# Patient Record
Sex: Female | Born: 1965 | Race: White | Hispanic: No | Marital: Married | Smoking: Never smoker
Health system: Southern US, Community
[De-identification: ages and names within clinical notes are randomized; demographics above are authoritative.]

## PROBLEM LIST (undated history)

## (undated) DIAGNOSIS — J45909 Unspecified asthma, uncomplicated: Secondary | ICD-10-CM

## (undated) DIAGNOSIS — F419 Anxiety disorder, unspecified: Secondary | ICD-10-CM

## (undated) DIAGNOSIS — J309 Allergic rhinitis, unspecified: Secondary | ICD-10-CM

## (undated) HISTORY — PX: AUGMENTATION MAMMAPLASTY: SUR837

## (undated) HISTORY — DX: Unspecified asthma, uncomplicated: J45.909

## (undated) HISTORY — DX: Anxiety disorder, unspecified: F41.9

## (undated) HISTORY — DX: Allergic rhinitis, unspecified: J30.9

---

## 2010-12-11 ENCOUNTER — Ambulatory Visit (HOSPITAL_BASED_OUTPATIENT_CLINIC_OR_DEPARTMENT_OTHER)
Admission: RE | Admit: 2010-12-11 | Discharge: 2010-12-11 | Disposition: A | Discharge status: Home / Self Care | Payer: BC Managed Care – PPO | Source: Ambulatory Visit | Attending: Orthopedic Surgery | Admitting: Orthopedic Surgery

## 2010-12-11 DIAGNOSIS — M2020 Hallux rigidus, unspecified foot: Secondary | ICD-10-CM | POA: Insufficient documentation

## 2010-12-11 DIAGNOSIS — G4733 Obstructive sleep apnea (adult) (pediatric): Secondary | ICD-10-CM | POA: Insufficient documentation

## 2010-12-11 DIAGNOSIS — J45909 Unspecified asthma, uncomplicated: Secondary | ICD-10-CM | POA: Insufficient documentation

## 2011-01-01 NOTE — Op Note (Signed)
NAME:  Vickie Ross, Vickie Ross                ACCOUNT NO.:  [id number]  MEDICAL RECORD NO.:  [id number]           PATIENT TYPE:  LOCATION:                                 FACILITY:  PHYSICIAN:  Toni Arthurs, MD             DATE OF BIRTH:  DATE OF PROCEDURE:  12/11/2010 DATE OF DISCHARGE:                              OPERATIVE REPORT   PREOPERATIVE DIAGNOSIS:  Right hallux rigidus, status post Moberg and chevron osteotomies.  POSTOPERATIVE DIAGNOSIS:  Right hallux rigidus, status post Moberg and chevron osteotomies.  PROCEDURE: 1. Right hallux removal of deep implant x2. 2. Right hallux metatarsophalangeal joint arthrodesis. 3. Intraoperative interpretation of fluoroscopic images.  SURGEON:  Toni Arthurs, MD  ANESTHESIA:  General, regional.  IV FLUIDS:  See anesthesia record.  ESTIMATED BLOOD LOSS:  Minimal.  TOURNIQUET TIME:  1 hour and 28 minutes with an ankle Esmarch.  COMPLICATIONS:  None apparent.  DISPOSITION:  Extubated awake and stable to recovery.  INDICATIONS FOR PROCEDURE:  The patient is a 45 year old female without significant past medical history who has a long history of right hallux MP joint pain.  She had a Moberg osteotomy combined with dorsiflexion osteotomy of the first metatarsal in the past.  She has continued to have significant pain limiting her activities.  She has failed conservative treatment with activity modification orthotics including a Morton's extension and oral anti-inflammatories.  She presents now for arthrodesis of her right hallux MP joint.  She understands the risks and benefits of this procedure including risks of bleeding, infection, nerve damage, blood clots, need for additional surgery, failure to heal, amputation, and death.  She would like to proceed.  PROCEDURE IN DETAIL:  After preoperative consent was obtained and the correct operative site was identified, the patient was brought to the operating room and placed supine on the  operating table.  General anesthesia was induced.  Regional anesthesia had previously been administered.  Preoperative antibiotics were administered.  Surgical time-out was taken.  The right lower extremity was prepped and draped in standard sterile fashion.  A longitudinal incision was marked on the skin just medial from the extensor hallucis longus tendon.  The extremity was exsanguinated and an Esmarch tourniquet was wrapped around the ankle.  The dorsal incision was made and sharp dissection was carried down through the skin.  Blunt dissection was carried down to the level of the EHL tendon.  The joint capsule was incised just medial to the tendon.  The tendon was retracted laterally and protected throughout the case.  The medial and lateral collateral ligaments were released. Immediately evident were significant osteophytes surrounding the joint as well as cartilage degeneration.  A 0.0625 K-wire was inserted in the center of the head of the metatarsal.  An 18-mm concave reamer was used to resect all of the articular cartilage down to healthy subchondral bone.  The 20-mm barrel reamer was then used to resect all peripheral osteophytes along with a rongeur.  The guide pin was removed.  The guidepin was then inserted into the center of the base of the proximal phalanx.  The convex 18-mm reamer was then used to resect all the articular cartilage back to healthy subchondral bone.  The wound was irrigated copiously.  All remaining bits of cartilage were removed.  The 2-mm drill bit was then used to perforate the base of the proximal phalanx and the head of the metatarsal creating local bone graft.  The joint was reduced.  A guide pin was then driven obliquely across the joint from the proximal medial aspect of the metaphyseal bone to the distal lateral aspect of the base of the proximal phalanx.  A partially threaded cannulated lag screw was then inserted over the guide pin through a stab  incision.  Excellent compression was noted across the joint as well as excellent purchase of the screw.  The head of the screw dorsally at the metatarsal was then identified.  A curette was used to remove all soft tissues from the screw heads.  Screw was then backed out in its entirety and passed off the field.  At the medial aspect of the hallux, a screw head was identified.  It was partially grown over with bone.  This was resected with a rongeur and curettes.  The screw was then backed out, removed in its entirety.  An Arthrex hallux MP joint arthrodesis plate was then selected and applied to the dorsum of the toe.  AP and lateral x-ray showed appropriate position of this plate as well as confirming appropriate position of the lag screw.  The plate was held in position with an olive wire.  The distal nonlocking hole was drilled and a fully-threaded screw was inserted securing the plate to the base of the proximal phalanx.  The proximal oblong hole was drilled eccentrically and a nonlocking bicortical screw was inserted further compressing the joint.  The remaining holes in the plate were drilled and filled with bicortical locking screws.  The position of the hallux was then tested with a footplate.  This had been done prior to insertion of the plate as well.  This confirmed the appropriate orientation of the hallux in dorsiflexion such that the tip of the toe was only 1 mm off the surface with simulated weightbearing.  The wound was then irrigated copiously.  Metaphyseal bone from the base of the proximal phalanx was resected medially.  This was overhanging bone that was then packed into the dorsal joint line.  Inverted simple sutures of 3-0 Monocryl were used to close the subcutaneous tissue and joint capsule over the plate. The skin was then closed with a running suture of 3-0 Prolene.  Sterile dressings were applied followed by a well-padded short-leg splint.  The patient was then  awakened from anesthesia and transported to the recovery room in stable condition.  FOLLOWUP PLAN:  The patient will be nonweightbearing on her splint for the next 2 weeks.  She will then follow up with me in clinic for suture removal and a wound check.     Toni Arthurs, MD     JH/MEDQ  D:  12/11/2010  T:  12/11/2010  Job:  [id number]  Electronically Signed by Toni Arthurs  on 01/01/2011 10:28:55 PM

## 2011-05-24 ENCOUNTER — Other Ambulatory Visit: Payer: Self-pay | Admitting: Family Medicine

## 2011-05-24 DIAGNOSIS — Z1231 Encounter for screening mammogram for malignant neoplasm of breast: Secondary | ICD-10-CM

## 2011-06-01 ENCOUNTER — Ambulatory Visit: Payer: BC Managed Care – PPO

## 2011-06-03 ENCOUNTER — Ambulatory Visit
Admission: RE | Admit: 2011-06-03 | Discharge: 2011-06-03 | Disposition: A | Discharge status: Home / Self Care | Payer: BC Managed Care – PPO | Source: Ambulatory Visit | Attending: Family Medicine | Admitting: Family Medicine

## 2011-06-03 DIAGNOSIS — Z1231 Encounter for screening mammogram for malignant neoplasm of breast: Secondary | ICD-10-CM

## 2011-06-09 ENCOUNTER — Other Ambulatory Visit: Payer: Self-pay | Admitting: Family Medicine

## 2011-06-09 DIAGNOSIS — R928 Other abnormal and inconclusive findings on diagnostic imaging of breast: Secondary | ICD-10-CM

## 2011-07-26 ENCOUNTER — Ambulatory Visit
Admission: RE | Admit: 2011-07-26 | Discharge: 2011-07-26 | Disposition: A | Discharge status: Home / Self Care | Payer: BC Managed Care – PPO | Source: Ambulatory Visit | Attending: Family Medicine | Admitting: Family Medicine

## 2011-07-26 ENCOUNTER — Other Ambulatory Visit: Payer: Self-pay | Admitting: Internal Medicine

## 2011-07-26 DIAGNOSIS — R928 Other abnormal and inconclusive findings on diagnostic imaging of breast: Secondary | ICD-10-CM

## 2012-07-03 ENCOUNTER — Other Ambulatory Visit: Payer: Self-pay | Admitting: Family Medicine

## 2012-07-03 DIAGNOSIS — Z1231 Encounter for screening mammogram for malignant neoplasm of breast: Secondary | ICD-10-CM

## 2012-08-04 ENCOUNTER — Ambulatory Visit
Admission: RE | Admit: 2012-08-04 | Discharge: 2012-08-04 | Disposition: A | Discharge status: Home / Self Care | Payer: BC Managed Care – PPO | Source: Ambulatory Visit | Attending: Family Medicine | Admitting: Family Medicine

## 2012-08-04 DIAGNOSIS — Z1231 Encounter for screening mammogram for malignant neoplasm of breast: Secondary | ICD-10-CM

## 2012-12-08 IMAGING — US US BREAST*R*
1 series · 3 of 3 positions shown · non-contrast
Comparison: 06/03/2011.

CLINICAL DATA: Call back from screening, right breast.

DIGITAL DIAGNOSTIC RIGHT MAMMOGRAM  AND RIGHT BREAST ULTRASOUND:

[Series 1: us breast*right* · 3 of 3 slices shown]
[im 1/3]
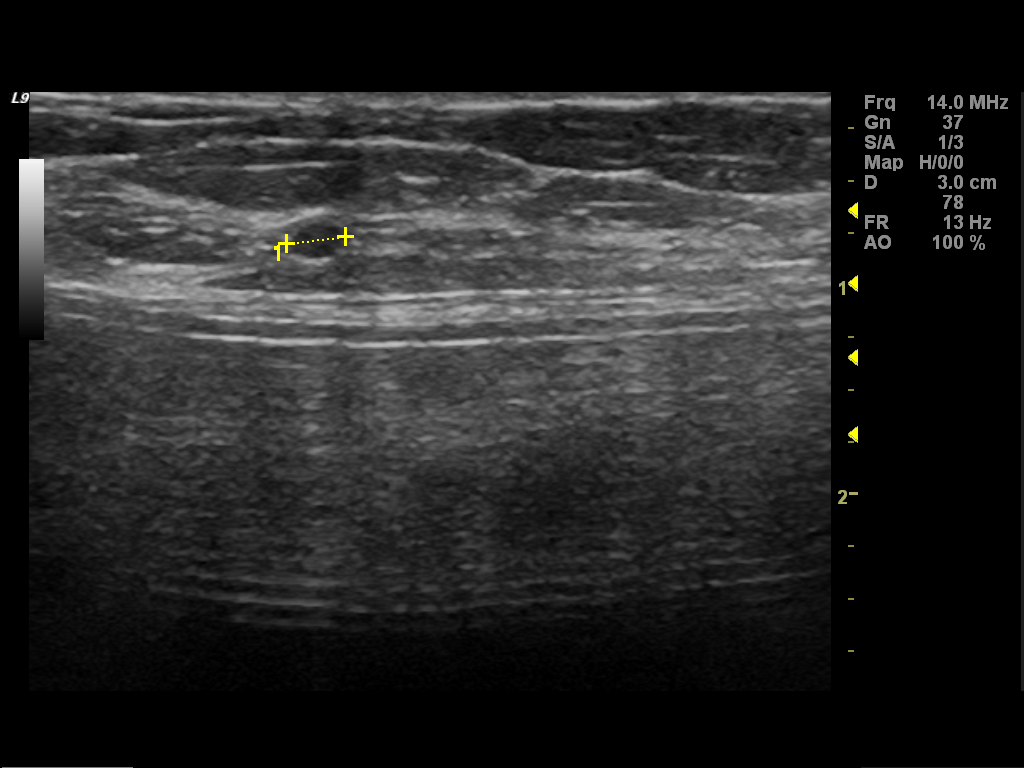
[im 2/3]
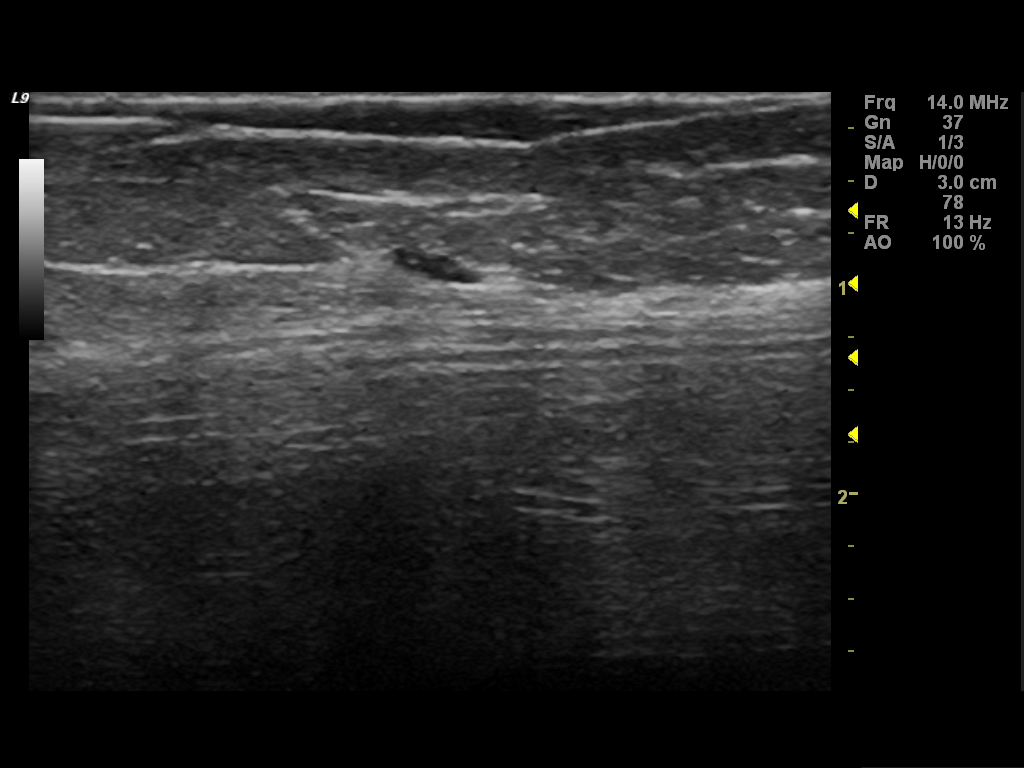
[im 3/3]
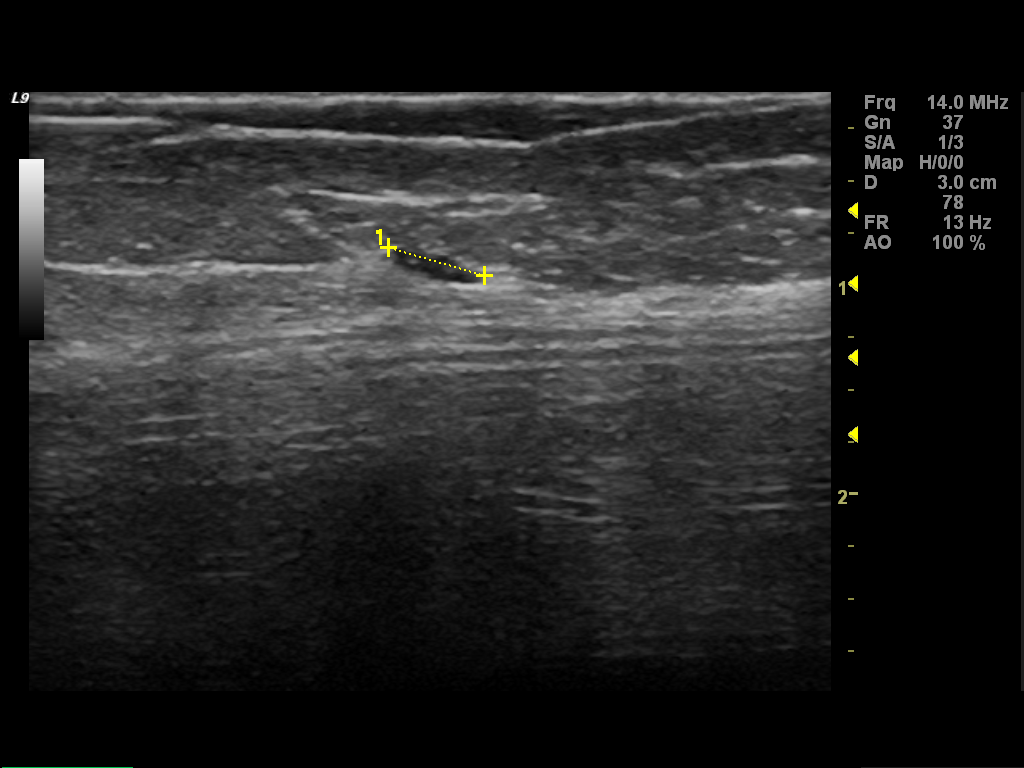

[3 of 3 positions shown; findings below may reference images not displayed]

FINDINGS: Spot magnification CC and MLO views demonstrate no mass,
nonsurgical distortion, or suspicious calcifications in the lateral
right breast.

On physical exam, I palpate no mass in the lateral right breast.

Ultrasound is performed, showing an approximate 5 x 3 mm cyst with
internal echoes in the 9 o'clock position approximately 4 cm from
the nipple.
IMPRESSION: Benign findings, right breast.  Approximate 5 mm complex cyst in
the 9 o'clock position approximately 4 cm from the nipple.

Recommendations:  Routine screening mammography in 1 year.

BI-RADS CATEGORY 2:  Benign finding(s).

## 2013-07-18 ENCOUNTER — Other Ambulatory Visit: Payer: Self-pay

## 2013-07-18 DIAGNOSIS — Z1231 Encounter for screening mammogram for malignant neoplasm of breast: Secondary | ICD-10-CM

## 2013-08-10 ENCOUNTER — Ambulatory Visit
Admission: RE | Admit: 2013-08-10 | Discharge: 2013-08-10 | Disposition: A | Discharge status: Home / Self Care | Payer: BC Managed Care – PPO | Source: Ambulatory Visit

## 2013-08-10 DIAGNOSIS — Z1231 Encounter for screening mammogram for malignant neoplasm of breast: Secondary | ICD-10-CM

## 2013-12-12 HISTORY — PX: FOOT SURGERY: SHX648

## 2014-07-09 ENCOUNTER — Other Ambulatory Visit: Payer: Self-pay

## 2014-07-09 DIAGNOSIS — Z1231 Encounter for screening mammogram for malignant neoplasm of breast: Secondary | ICD-10-CM

## 2014-08-23 ENCOUNTER — Encounter (INDEPENDENT_AMBULATORY_CARE_PROVIDER_SITE_OTHER): Payer: Self-pay

## 2014-08-23 ENCOUNTER — Ambulatory Visit
Admission: RE | Admit: 2014-08-23 | Discharge: 2014-08-23 | Disposition: A | Discharge status: Home / Self Care | Payer: BC Managed Care – PPO | Source: Ambulatory Visit

## 2014-08-23 DIAGNOSIS — Z1231 Encounter for screening mammogram for malignant neoplasm of breast: Secondary | ICD-10-CM

## 2015-07-08 ENCOUNTER — Ambulatory Visit: Payer: Self-pay | Admitting: Pediatrics

## 2015-07-28 ENCOUNTER — Other Ambulatory Visit: Payer: Self-pay

## 2015-07-28 DIAGNOSIS — Z1231 Encounter for screening mammogram for malignant neoplasm of breast: Secondary | ICD-10-CM

## 2015-08-04 ENCOUNTER — Encounter: Payer: Self-pay | Admitting: Allergy and Immunology

## 2015-08-04 ENCOUNTER — Ambulatory Visit (INDEPENDENT_AMBULATORY_CARE_PROVIDER_SITE_OTHER): Payer: BLUE CROSS/BLUE SHIELD | Admitting: Allergy and Immunology

## 2015-08-04 VITALS — BP 110/70 | HR 76 | Temp 97.8°F | Resp 16 | Ht 67.5 in | Wt 150.8 lb

## 2015-08-04 DIAGNOSIS — T7800XA Anaphylactic reaction due to unspecified food, initial encounter: Secondary | ICD-10-CM

## 2015-08-04 DIAGNOSIS — J4520 Mild intermittent asthma, uncomplicated: Secondary | ICD-10-CM | POA: Diagnosis not present

## 2015-08-04 DIAGNOSIS — J310 Chronic rhinitis: Secondary | ICD-10-CM | POA: Diagnosis not present

## 2015-08-04 MED ORDER — ALBUTEROL SULFATE HFA 108 (90 BASE) MCG/ACT IN AERS: 2.0000 | INHALATION_SPRAY | RESPIRATORY_TRACT | Status: AC | PRN

## 2015-08-04 MED ORDER — EPINEPHRINE 0.3 MG/0.3ML IJ SOAJ: INTRAMUSCULAR | Status: AC

## 2015-08-04 MED ORDER — FLUTICASONE PROPIONATE 50 MCG/ACT NA SUSP: NASAL | Status: DC

## 2015-08-04 NOTE — Patient Instructions (Signed)
Food allergy Food allergy. 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  Meticulous avoidance of egg and dairy products as discussed.  A prescription has been provided for epinephrine auto-injector 2 pack along with instructions for proper administration.  A food allergy action plan has been provided and discussed.  Medic Alert identification is recommended.   Chronic rhinitis Non-allergic rhinitis.  All seasonal and perennial aeroallergen skin tests are negative despite a positive histamine control.  Intranasal steroids and intranasal antihistamines are effective for symptoms associated with non-allergic rhinitis, whereas second generation antihistamines such as cetirizine, loratadine and fexofenadine have been found to be ineffective for this condition.  A prescription has been provided for fluticasone nasal spray, one spray per nostril 1-2 times daily as needed. Proper nasal spray technique has been discussed and demonstrated.  Nasal saline lavage as needed has been recommended along with instructions for proper administration.  Mild intermittent asthma  Continue albuterol HFA, 1-2 inhalations every 4-6 hours as needed and 15 minutes prior to vigorous exercise.  Subjective and objective measures of pulmonary function will be followed and the treatment plan will be adjusted accordingly.   Return in about 6 months (around 02/01/2016), or if symptoms worsen or fail to improve.

## 2015-08-04 NOTE — Assessment & Plan Note (Signed)
   Continue albuterol HFA, 1-2 inhalations every 4-6 hours as needed and 15 minutes prior to vigorous exercise.  Subjective and objective measures of pulmonary function will be followed and the treatment plan will be adjusted accordingly. 

## 2015-08-04 NOTE — Assessment & Plan Note (Addendum)
Food allergy. 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  Meticulous avoidance of egg and dairy products as discussed.  A prescription has been provided for epinephrine auto-injector 2 pack along with instructions for proper administration.  A food allergy action plan has been provided and discussed.  Medic Alert identification is recommended.

## 2015-08-04 NOTE — Assessment & Plan Note (Signed)
Non-allergic rhinitis.  All seasonal and perennial aeroallergen skin tests are negative despite a positive histamine control.  Intranasal steroids and intranasal antihistamines are effective for symptoms associated with non-allergic rhinitis, whereas second generation antihistamines such as cetirizine, loratadine and fexofenadine have been found to be ineffective for this condition.  A prescription has been provided for fluticasone nasal spray, one spray per nostril 1-2 times daily as needed. Proper nasal spray technique has been discussed and demonstrated.  Nasal saline lavage as needed has been recommended along with instructions for proper administration. 

## 2015-08-04 NOTE — Progress Notes (Signed)
History of present illness: HPI Comments: Vickie Ross is a 49 y.o. female who presents today for her initial consultation of possible food allergies.  She reports that she has experienced GI symptoms which she describes as "like having the stomach flu", including bloating, abdominal cramping, and diarrhea "all day."  On occasion, she has experienced chest tightness and dyspnea in conjunction with the GI symptoms.  Specific food triggers include eggs and, to a lesser degree, dairy products. These symptoms have progressed over the past decade.   She was found to be H. pylori positive, however after H. pylori was eradicated there was no discernible symptom improvement.  She was diagnosed with asthma 16 years ago.  Her symptoms are triggered by exercise and upper respiratory tract infections.  She experiences nasal congestion, postnasal drainage, and sinus pressure between the eyes.  She describes these nasal/sinus symptoms as "horrible all of the time."   No significant seasonal symptom variation has been noted nor have specific environmental triggers been identified.    Assessment and plan: Food allergy Food allergy. 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  Meticulous avoidance of egg and dairy products as discussed.  A prescription has been provided for epinephrine auto-injector 2 pack along with instructions for proper administration.  A food allergy action plan has been provided and discussed.  Medic Alert identification is recommended.  Chronic rhinitis Non-allergic rhinitis.  All seasonal and perennial aeroallergen skin tests are negative despite a positive histamine control.  Intranasal steroids and intranasal antihistamines are effective for symptoms associated with non-allergic rhinitis, whereas second generation antihistamines such as cetirizine, loratadine and fexofenadine have been found to be ineffective for  this condition.  A prescription has been provided for fluticasone nasal spray, one spray per nostril 1-2 times daily as needed. Proper nasal spray technique has been discussed and demonstrated.  Nasal saline lavage as needed has been recommended along with instructions for proper administration.  Mild intermittent asthma  Continue albuterol HFA, 1-2 inhalations every 4-6 hours as needed and 15 minutes prior to vigorous exercise.  Subjective and objective measures of pulmonary function will be followed and the treatment plan will be adjusted accordingly.    Medications ordered this encounter: Meds ordered this encounter  Medications  . EPINEPHrine (EPIPEN 2-PAK) 0.3 mg/0.3 mL IJ SOAJ injection    Sig: USE AS DIRECTED FOR SEVERE ALLERGIC REACTION.    Dispense:  1 Device    Refill:  2  . fluticasone (FLONASE) 50 MCG/ACT nasal spray    Sig: ONE SPRAY EACH NOSTRIL ONE TO TWO TIMES A DAY AS NEEDED FOR NASAL CONGESTION OR DRAINAGE.    Dispense:  16 g    Refill:  5  . albuterol (PROAIR HFA) 108 (90 BASE) MCG/ACT inhaler    Sig: Inhale 2 puffs into the lungs every 4 (four) hours as needed for wheezing or shortness of breath.    Dispense:  1 Inhaler    Refill:  3    Diagnositics: Spirometry: Normal with an FEV1 of 97% predicted. Please see scanned spirometry results for details. Environmental skin testing: Negative despite a positive histamine control. Food allergen skin testing: Positive to egg white, cow's milk, and casein.    Physical examination: Blood pressure 110/70, pulse 76, temperature 97.8 F (36.6 C), temperature source Oral, resp. rate 16, height 5' 7.5" (1.715 m), weight 150 lb 12.8 oz (68.402 kg).  General: Alert, interactive, in no acute distress. HEENT: TMs  pearly gray, turbinates moderately edematous without discharge, post-pharynx mildly erythematous. Neck: Supple without lymphadenopathy. Lungs: Clear to auscultation without wheezing, rhonchi or rales. CV: Normal  S1, S2 without murmurs. Abdomen: Nondistended, nontender. Skin: Warm and dry, without lesions or rashes. Extremities:  No clubbing, cyanosis or edema. Neuro:   Grossly intact.  Review of systems: Review of Systems  Constitutional: Negative for fever, chills and weight loss.  HENT: Negative for nosebleeds.   Eyes: Negative for blurred vision.  Respiratory: Negative for hemoptysis.   Cardiovascular: Negative for chest pain.  Gastrointestinal: Positive for abdominal pain and diarrhea. Negative for constipation.  Genitourinary: Negative for dysuria.  Musculoskeletal: Negative for myalgias and joint pain.  Neurological: Negative for dizziness.  Endo/Heme/Allergies: Does not bruise/bleed easily.    Past medical history: Past Medical History  Diagnosis Date  . Asthma   . Allergic rhinitis     Past surgical history: Past Surgical History  Procedure Laterality Date  . Foot surgery Right 12/2013    Family history: Family History  Problem Relation Age of Onset  . Asthma Mother   . Allergic rhinitis Mother   . Angioedema Neg Hx   . Eczema Neg Hx   . Immunodeficiency Neg Hx   . Urticaria Neg Hx   . Asthma Son   . Allergic rhinitis Son     Social history: Social History   Social History  . Marital Status: Married    Spouse Name: N/A  . Number of Children: N/A  . Years of Education: N/A   Occupational History  . Not on file.   Social History Main Topics  . Smoking status: Never Smoker   . Smokeless tobacco: Never Used  . Alcohol Use: 1.2 oz/week    1 Glasses of wine, 1 Shots of liquor per week  . Drug Use: No  . Sexual Activity: Yes   Other Topics Concern  . Not on file   Social History Narrative  . No narrative on file   Environmental History:  Zitlalli lives in a 64-year-old house with carpeting in the bedroom and central air/heat.  There is a dog in house which has access to her bedroom. She is a nonsmoker anere are no smokers in the household.  Known  medication allergies: Allergies  Allergen Reactions  . Eggs Or Egg-Derived Products Diarrhea and Nausea And Vomiting    Stomach cramping    Outpatient medications:   Medication List       This list is accurate as of: 08/04/15 10:20 AM.  Always use your most recent med list.               albuterol 108 (90 BASE) MCG/ACT inhaler  Commonly known as:  PROAIR HFA  Inhale 2 puffs into the lungs every 4 (four) hours as needed for wheezing or shortness of breath.     EPINEPHrine 0.3 mg/0.3 mL Soaj injection  Commonly known as:  EPIPEN 2-PAK  USE AS DIRECTED FOR SEVERE ALLERGIC REACTION.     fluticasone 50 MCG/ACT nasal spray  Commonly known as:  FLONASE  ONE SPRAY EACH NOSTRIL ONE TO TWO TIMES A DAY AS NEEDED FOR NASAL CONGESTION OR DRAINAGE.     metroNIDAZOLE 1 % gel  Commonly known as:  METROGEL     minocycline 100 MG capsule  Commonly known as:  MINOCIN,DYNACIN     MULTI-VITAMINS Tabs  Take by mouth.     NEOMYCIN-POLYMYXIN-HYDROCORTISONE 1 % Soln otic solution  Commonly known as:  CORTISPORIN  PLACE 1  TO 2 DROPS ON NAIL-BED BID     sulfamethoxazole-trimethoprim 800-160 MG tablet  Commonly known as:  BACTRIM DS,SEPTRA DS  TK 1 T PO QD        I appreciate the opportunity to take part in this [place] care. Please do not hesitate to contact me with questions.  Sincerely,   R. Edgar Frisk, MD

## 2015-08-05 ENCOUNTER — Encounter: Payer: Self-pay | Admitting: *Deleted

## 2015-09-02 ENCOUNTER — Ambulatory Visit
Admission: RE | Admit: 2015-09-02 | Discharge: 2015-09-02 | Disposition: A | Discharge status: Home / Self Care | Payer: BLUE CROSS/BLUE SHIELD | Source: Ambulatory Visit

## 2015-09-02 DIAGNOSIS — Z1231 Encounter for screening mammogram for malignant neoplasm of breast: Secondary | ICD-10-CM

## 2016-06-18 ENCOUNTER — Other Ambulatory Visit: Payer: Self-pay | Admitting: Physician Assistant

## 2016-06-18 ENCOUNTER — Other Ambulatory Visit: Payer: Self-pay | Admitting: Anesthesiology

## 2016-06-18 DIAGNOSIS — Z1231 Encounter for screening mammogram for malignant neoplasm of breast: Secondary | ICD-10-CM

## 2016-09-03 ENCOUNTER — Ambulatory Visit
Admission: RE | Admit: 2016-09-03 | Discharge: 2016-09-03 | Disposition: A | Discharge status: Home / Self Care | Payer: BLUE CROSS/BLUE SHIELD | Source: Ambulatory Visit | Attending: Anesthesiology | Admitting: Anesthesiology

## 2016-09-03 DIAGNOSIS — Z1231 Encounter for screening mammogram for malignant neoplasm of breast: Secondary | ICD-10-CM

## 2016-09-16 ENCOUNTER — Other Ambulatory Visit: Payer: Self-pay | Admitting: Physician Assistant

## 2016-09-16 DIAGNOSIS — Z1231 Encounter for screening mammogram for malignant neoplasm of breast: Secondary | ICD-10-CM

## 2017-09-09 ENCOUNTER — Ambulatory Visit
Admission: RE | Admit: 2017-09-09 | Discharge: 2017-09-09 | Disposition: A | Discharge status: Home / Self Care | Payer: PRIVATE HEALTH INSURANCE | Source: Ambulatory Visit | Attending: Physician Assistant | Admitting: Physician Assistant

## 2017-09-09 DIAGNOSIS — Z1231 Encounter for screening mammogram for malignant neoplasm of breast: Secondary | ICD-10-CM

## 2018-08-08 ENCOUNTER — Other Ambulatory Visit: Payer: Self-pay | Admitting: Internal Medicine

## 2018-08-08 DIAGNOSIS — Z1231 Encounter for screening mammogram for malignant neoplasm of breast: Secondary | ICD-10-CM

## 2018-09-12 ENCOUNTER — Encounter: Payer: Self-pay | Admitting: Gastroenterology

## 2018-09-29 ENCOUNTER — Ambulatory Visit
Admission: RE | Admit: 2018-09-29 | Discharge: 2018-09-29 | Disposition: A | Discharge status: Home / Self Care | Payer: PRIVATE HEALTH INSURANCE | Source: Ambulatory Visit | Attending: Internal Medicine | Admitting: Internal Medicine

## 2018-09-29 ENCOUNTER — Encounter: Payer: Self-pay | Admitting: Radiology

## 2018-09-29 DIAGNOSIS — Z1231 Encounter for screening mammogram for malignant neoplasm of breast: Secondary | ICD-10-CM

## 2018-11-01 ENCOUNTER — Ambulatory Visit: Payer: PRIVATE HEALTH INSURANCE | Admitting: Gastroenterology

## 2018-11-03 ENCOUNTER — Ambulatory Visit: Payer: PRIVATE HEALTH INSURANCE | Admitting: Gastroenterology

## 2018-11-10 ENCOUNTER — Ambulatory Visit: Payer: PRIVATE HEALTH INSURANCE | Admitting: Gastroenterology

## 2018-11-10 ENCOUNTER — Encounter (INDEPENDENT_AMBULATORY_CARE_PROVIDER_SITE_OTHER): Payer: Self-pay

## 2018-11-10 ENCOUNTER — Encounter: Payer: Self-pay | Admitting: Gastroenterology

## 2018-11-10 VITALS — BP 118/78 | HR 76 | Ht 67.5 in | Wt 143.0 lb

## 2018-11-10 DIAGNOSIS — R197 Diarrhea, unspecified: Secondary | ICD-10-CM | POA: Diagnosis not present

## 2018-11-10 DIAGNOSIS — R109 Unspecified abdominal pain: Secondary | ICD-10-CM

## 2018-11-10 MED ORDER — SUPREP BOWEL PREP KIT 17.5-3.13-1.6 GM/177ML PO SOLN: 1.0000 | ORAL | 0 refills | Status: DC

## 2018-11-10 NOTE — Progress Notes (Signed)
Referring Provider: Josephina Gip, NP Primary Care Physician:  Concepcion Elk, MD   Reason for Consultation:  Diarrhea, bloating, IBS   IMPRESSION:  Post-prandial abdominal pain/cramping and diarrhea   - Prior testing negative for celiac, Giardia, Cryptosporidium   - Fecal lactoferrin negative   - Bentyl worsened her symptoms   - Viberzi 100 mg BID triggered her asthma   - no improvement despite probiotics, PeptoBismal, Phazyme, Imodium History of H. Pylori History of Giardia Food allergies by blood: gluten, eggs, dairy, casein, peanuts, lettuce  Prior diagnosis of IBS Recent diagnoses of iritis and uveitis Cytopenias awaiting evaluation by hematology   Differential for post-prandial abdominal pain/cramping and diarrhea is broad and includes, but is not limited to,  irritable bowel syndrome, IBD (given new diagnoses of iritis and uveitis), food intolerance, gastroparesis, symptomatic gallbladder disease, microscopic colitis, thyroid disorder, other functional GI disease.   PLAN: Colonoscopy with biopsies and EGD with biopsies Obtain records from her prior gastroenterologist (colonoscopy performed at that time) Obtain records from PCP documenting cytopenias If endoscopy is negative, will proceed with abdominal imaging +/- HIDA with CCK  I consented the patient at the bedside today discussing the risks, benefits, and alternatives to endoscopic evaluation. In particular, we discussed the risks that include, but are not limited to, reaction to medication, cardiopulmonary compromise, bleeding requiring blood transfusion, aspiration resulting in pneumonia, perforation requiring surgery, lack of diagnosis, severe illness requiring hospitalization, and even death. We reviewed the risk of missed lesion including polyps or even cancer. The patient acknowledges these risks and asks that we proceed.   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  The history is obtained to the patient and review of her electronic health record.  I have also reviewed referral records from [facility] health.  Her husband accompanies her to this appointment.  10 year history of GI symptoms including abdominal pain, nausea, bloating, and diarrhea..  Her husband notes that symptoms have progressed over the last 5 years. He is concerned that any solid food triggers her symptoms.  Vickie Ross notes that they are worse over the last several months.  She reports a history of:  H pylori - now drinks only bottled water Giardia x 2 - source unknown, washes everything with vinegar and water [facility] diagnosed her with a parasite Food allergies by blood: gluten, eggs, dairy, casein, peanuts, lettuce   Previously evaluated by Dr. Ivin Booty.  Had a diagnosis of IBS at that time.  Colonoscopy in October 2014 for rectal bleeding and a family history of colon polyps revealed diverticulosis and internal hemorrhoids.  Labs in 2015 showing normal stool culture, negative H. pylori, negative Giardia, negative Cryptosporidium, negative lactoferrin, negative O&P, negative C. difficile, and negative celiac panel.  Gastroenterologist recommended Bentyl which seemed to exacerbate her symptoms.   The doctor recommended a higher dose but she didn't feel comfortable increasing the dose. Viberzi 100 mg BID recommended but also exacerbated her abdominal cramping and diarrhea.  Did not return to see her again.  This gastroenterologist did an upper endoscopy at the time of colonoscopy. No prior imaging studies.   She has multiple food allergies and lactose intolerance, but despite dietary changes she continues to have postprandial symptoms abdominal squeezing/cramping and diarrhea.  As soon as something hits her stomach she feels a churning sensation and noted borborygmous. Symptoms can last for 3-4 hours.  They are occurring  at least 3-4 times per  week.  This is embarrassing to her at work.  Feels like the gas gets trapped and can't get out with radiation to the back. Gas and bloating develop within 10 minutes of eating. No distension. No eructation or flatus.    Associated fatigue. Hungry but can't eat due to illness. Forces herself to go to the gym. Weight loss initially but she has regained the weight. She attributes this to eating too much fruit.   Evaluated by rheumatology for autoimmune disease.  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 Using steroids drops.  Recently approved for Humira.  Has an upcoming appointment with hematology to evaluate newly diagnosed cytopenias including neutropenia and thrombocytopenia.  Did not want to start until she had endoscopy. Patient is concerned that she has Crohn's disease.   Did not feel well taking Bentyl Trial of OTC for acid reflux, PeptoBismal, Phazyme.  Trial of Viberzi 100 mg BID triggered her asthma.  Uses Xanax.  On Probiotics for at least 8 years. Stopped 5 month ago because she didn't think they were helping.  Tried multiple varieties.  No improvement with Imodium Currently following a vegan diet.  Attributes her decline to her H yplori. Nothing has gotten better since then.  She is concerned that her H. pylori has returned. Also concerned about Crohn's disease and stomach cancer.   Paternal grandmother with colon cancer at an advanced age.  There is no other known family history of colon cancer or polyps.  Past Medical History:  Diagnosis Date  . Allergic rhinitis   . Anxiety   . Asthma     Past Surgical History:  Procedure Laterality Date  . FOOT SURGERY Right 12/2013    Current Outpatient Medications  Medication Sig Dispense Refill  . albuterol (PROAIR HFA) 108 (90 BASE) MCG/ACT inhaler Inhale 2 puffs into the lungs every 4 (four) hours as needed for wheezing or shortness of breath. 1 Inhaler 3  . EPINEPHrine (EPIPEN 2-PAK) 0.3 mg/0.3 mL IJ SOAJ injection USE AS DIRECTED FOR  SEVERE ALLERGIC REACTION. 1 Device 2  . metroNIDAZOLE (METROGEL) 1 % gel     . minocycline (MINOCIN,DYNACIN) 100 MG capsule     . Multiple Vitamin (MULTI-VITAMINS) TABS Take by mouth.    [place] [employer]unning BOWEL PREP KIT 17.5-3.13-1.6 GM/177ML SOLN Take 1 kit by mouth as directed. For colonoscopy prep 2 Bottle 0   No current facility-administered medications for this visit.     Allergies as of 11/10/2018 - Review Complete 11/10/2018  Allergen Reaction Noted  . Eggs or egg-derived products Diarrhea and Nausea And Vomiting 08/04/2015    Family History  Problem Relation Age of Onset  . Asthma Mother   . Allergic rhinitis Mother   . Asthma Son   . Allergic rhinitis Son   . Breast cancer Maternal Aunt 75       x2  . Angioedema Neg Hx   . Eczema Neg Hx   . Immunodeficiency Neg Hx   . Urticaria Neg Hx     Social History   Socioeconomic History  . Marital status: Married    Spouse name: Not on file  . Number of children: Not on file  . Years of education: Not on file  . Highest education level: Not on file  Occupational History  . Not on file  Social Needs  . Financial resource strain: Not on file  . Food insecurity:    Worry: Not on file    Inability: Not on  file  . Transportation needs:    Medical: Not on file    Non-medical: Not on file  Tobacco Use  . Smoking status: Never Smoker  . Smokeless tobacco: Never Used  Substance and Sexual Activity  . Alcohol use: Yes    Alcohol/week: 2.0 standard drinks    Types: 1 Glasses of wine, 1 Shots of liquor per week  . Drug use: No  . Sexual activity: Yes  Lifestyle  . Physical activity:    Days per week: Not on file    Minutes per session: Not on file  . Stress: Not on file  Relationships  . Social connections:    Talks on phone: Not on file    Gets together: Not on file    Attends religious service: Not on file    Active member of club or organization: Not on file    Attends meetings of clubs or organizations: Not on file     Relationship status: Not on file  . Intimate partner violence:    Fear of current or ex partner: Not on file    Emotionally abused: Not on file    Physically abused: Not on file    Forced sexual activity: Not on file  Other Topics Concern  . Not on file  Social History Narrative  . Not on file    Review of Systems: 12 system ROS is negative except as noted above with the additions of anxiety, back pain, fatigue, myalgias, insomnia.  Filed Weights   11/10/18 1349  Weight: 143 lb (64.9 kg)    Physical Exam: Vital signs were reviewed. General:   Alert, well-nourished, pleasant and cooperative in NAD Head:  Normocephalic and atraumatic. Eyes:  Sclera clear, no icterus.   Conjunctiva pink. Mouth:  No deformity or lesions.   Neck:  Supple; no thyromegaly. Lungs:  Clear throughout to auscultation.   No wheezes.  Heart:  Regular rate and rhythm; no murmurs Abdomen:  Soft, nontender, normal bowel sounds. No rebound or guarding. No hepatosplenomegaly Rectal:  Deferred  Msk:  Symmetrical without gross deformities. Extremities:  No gross deformities or edema. Neurologic:  Alert and  oriented x4;  grossly nonfocal Skin:  No rash or bruise. Psych:  Alert and cooperative. Normal mood and affect.   Emeterio Balke L. Tarri Glenn, MD, MPH [facility] 11/23/2018, 4:12 PM

## 2018-11-10 NOTE — Patient Instructions (Addendum)
Please make a list of all of the medications that you have tried: over the counter, prescription, and herbal/complementary treatments.  Please resume Align daily.   [email or web address]  I have recommended a colonoscopy and upper endoscopy for further evaluation.   If you are age 54 or younger, your body mass index should be between 19-25. Your Body mass index is 22.07 kg/m. If this is out of the aformentioned range listed, please consider follow up with your Primary Care Provider.   You have been scheduled for an endoscopy and colonoscopy. Please follow the written instructions given to you at your visit today. Please pick up your prep supplies at the pharmacy within the next 1-3 days. If you use inhalers (even only as needed), please bring them with you on the day of your procedure. Your physician has requested that you go to www.startemmi.com and enter the access code given to you at your visit today. This web site gives a general overview about your procedure. However, you should still follow specific instructions given to you by our office regarding your preparation for the procedure.   Thank you for choosing me and [facility].  Dr. Thornton Park

## 2018-11-23 ENCOUNTER — Encounter: Payer: Self-pay | Admitting: Gastroenterology

## 2018-12-05 ENCOUNTER — Telehealth: Payer: Self-pay | Admitting: Gastroenterology

## 2018-12-05 NOTE — Telephone Encounter (Signed)
Left message on machine to call back  

## 2018-12-05 NOTE — Telephone Encounter (Signed)
Dr. Deland Pretty please review for urgency

## 2018-12-05 NOTE — Telephone Encounter (Signed)
Based on guidelines from the [facility], her procedures must be rescheduled. She can start Humira for another indication (if necessary and appropriate in the eyes of the prescribing physician), and we will answer questions about Crohn's later. We want to keep her safe. Thank you.

## 2018-12-05 NOTE — Telephone Encounter (Signed)
Pt called would like to know if she can keep her appt for her procedure. Pt said that she need it done to check if she has chron's disease and until she has her procedure she will not be able to start the humira shot.  Procedure is on 12-11-18

## 2018-12-06 NOTE — Telephone Encounter (Signed)
Pt is returning your call.  She is requesting a call back today [phone]

## 2018-12-06 NOTE — Telephone Encounter (Signed)
Patient is advised.  

## 2018-12-11 ENCOUNTER — Encounter: Payer: PRIVATE HEALTH INSURANCE | Admitting: Gastroenterology

## 2019-02-01 ENCOUNTER — Encounter: Payer: Self-pay | Admitting: Emergency Medicine

## 2019-02-01 ENCOUNTER — Other Ambulatory Visit: Payer: Self-pay | Admitting: Emergency Medicine

## 2019-02-01 DIAGNOSIS — R197 Diarrhea, unspecified: Secondary | ICD-10-CM

## 2019-02-01 DIAGNOSIS — R109 Unspecified abdominal pain: Secondary | ICD-10-CM

## 2019-02-01 MED ORDER — NA SULFATE-K SULFATE-MG SULF 17.5-3.13-1.6 GM/177ML PO SOLN: 1.0000 | ORAL | 0 refills | Status: DC

## 2019-02-06 ENCOUNTER — Telehealth: Payer: Self-pay

## 2019-02-06 NOTE — Telephone Encounter (Signed)
Left message

## 2019-02-07 ENCOUNTER — Telehealth: Payer: Self-pay | Admitting: *Deleted

## 2019-02-07 NOTE — Telephone Encounter (Signed)
No answer for second COVId screen, left message regarding care partner policy and bringing a mask with them if they have one available. SM

## 2019-02-08 ENCOUNTER — Encounter: Payer: Self-pay | Admitting: Gastroenterology

## 2019-02-08 ENCOUNTER — Ambulatory Visit (AMBULATORY_SURGERY_CENTER): Payer: PRIVATE HEALTH INSURANCE | Admitting: Gastroenterology

## 2019-02-08 ENCOUNTER — Other Ambulatory Visit: Payer: Self-pay

## 2019-02-08 VITALS — BP 106/67 | HR 53 | Temp 98.7°F | Resp 8 | Ht 67.0 in | Wt 143.0 lb

## 2019-02-08 DIAGNOSIS — K3189 Other diseases of stomach and duodenum: Secondary | ICD-10-CM | POA: Diagnosis not present

## 2019-02-08 DIAGNOSIS — K648 Other hemorrhoids: Secondary | ICD-10-CM | POA: Diagnosis not present

## 2019-02-08 DIAGNOSIS — R1084 Generalized abdominal pain: Secondary | ICD-10-CM

## 2019-02-08 DIAGNOSIS — K644 Residual hemorrhoidal skin tags: Secondary | ICD-10-CM

## 2019-02-08 DIAGNOSIS — D122 Benign neoplasm of ascending colon: Secondary | ICD-10-CM

## 2019-02-08 DIAGNOSIS — K5730 Diverticulosis of large intestine without perforation or abscess without bleeding: Secondary | ICD-10-CM

## 2019-02-08 DIAGNOSIS — K259 Gastric ulcer, unspecified as acute or chronic, without hemorrhage or perforation: Secondary | ICD-10-CM | POA: Diagnosis not present

## 2019-02-08 DIAGNOSIS — R197 Diarrhea, unspecified: Secondary | ICD-10-CM

## 2019-02-08 MED ORDER — PANTOPRAZOLE SODIUM 40 MG PO TBEC: 40.0000 mg | DELAYED_RELEASE_TABLET | Freq: Two times a day (BID) | ORAL | 0 refills | Status: DC

## 2019-02-08 MED ORDER — SODIUM CHLORIDE 0.9 % IV SOLN: 500.0000 mL | Freq: Once | INTRAVENOUS | Status: DC

## 2019-02-08 NOTE — Progress Notes (Signed)
To PACU, vss. Report to [email or web address] 

## 2019-02-08 NOTE — Patient Instructions (Signed)
Handouts given for Gastritis, Gerd, hemorrhoids and diverticulosis and polyp.  You need to pick up a new prescription today at [facility].  YOU HAD AN ENDOSCOPIC PROCEDURE TODAY AT [facility]:   Refer to the procedure report that was given to you for any specific questions about what was found during the examination.  If the procedure report does not answer your questions, please call your gastroenterologist to clarify.  If you requested that your care partner not be given the details of your procedure findings, then the procedure report has been included in a sealed envelope for you to review at your convenience later.  YOU SHOULD EXPECT: Some feelings of bloating in the abdomen. Passage of more gas than usual.  Walking can help get rid of the air that was put into your GI tract during the procedure and reduce the bloating. If you had a lower endoscopy (such as a colonoscopy or flexible sigmoidoscopy) you may notice spotting of blood in your stool or on the toilet paper. If you underwent a bowel prep for your procedure, you may not have a normal bowel movement for a few days.  Please Note:  You might notice some irritation and congestion in your nose or some drainage.  This is from the oxygen used during your procedure.  There is no need for concern and it should clear up in a day or so.  SYMPTOMS TO REPORT IMMEDIATELY:   Following lower endoscopy (colonoscopy or flexible sigmoidoscopy):  Excessive amounts of blood in the stool  Significant tenderness or worsening of abdominal pains  Swelling of the abdomen that is new, acute  Fever of 100F or higher   Following upper endoscopy (EGD)  Vomiting of blood or coffee ground material  New chest pain or pain under the shoulder blades  Painful or persistently difficult swallowing  New shortness of breath  Fever of 100F or higher  Black, tarry-looking stools  For urgent or emergent issues, a gastroenterologist can be reached at  any hour by calling [phone].   DIET:  We do recommend a small meal at first, but then you may proceed to your regular diet.  Drink plenty of fluids but you should avoid alcoholic beverages for 24 hours.  ACTIVITY:  You should plan to take it easy for the rest of today and you should NOT DRIVE or use heavy machinery until tomorrow (because of the sedation medicines used during the test).    FOLLOW UP: Our staff will call the number listed on your records 48-72 hours following your procedure to check on you and address any questions or concerns that you may have regarding the information given to you following your procedure. If we do not reach you, we will leave a message.  We will attempt to reach you two times.  During this call, we will ask if you have developed any symptoms of COVID 19. If you develop any symptoms (ie: fever, flu-like symptoms, shortness of breath, cough etc.) before then, please call [phone].  If you test positive for Covid 19 in the 2 weeks post procedure, please call and report this information to [place].    If any biopsies were taken you will be contacted by phone or by letter within the next 1-3 weeks.  Please call us at [phone] if you have not heard about the biopsies in 3 weeks.    SIGNATURES/CONFIDENTIALITY: You and/or your care partner have signed paperwork which will be entered into your electronic medical  record.  These signatures attest to the fact that that the information above on your After Visit Summary has been reviewed and is understood.  Full responsibility of the confidentiality of this discharge information lies with you and/or your care-partner. 

## 2019-02-08 NOTE — Progress Notes (Signed)
Pt's states no medical or surgical changes since previsit or office visit. 

## 2019-02-08 NOTE — Op Note (Signed)
[facility] Patient Name: Vickie Ross Procedure Date: 02/08/2019 8:43 AM MRN: [id number] Endoscopist: Thornton Park MD, MD Age: 53 Referring MD:  Date of Birth: 1966-03-27 Gender: Female Account #: [id number] Procedure:                Upper GI endoscopy Indications:              Generalized abdominal pain                           Post-prandial abdominal pain/cramping and diarrhea                           History of H. Pylori                           History of Giardia                           Food allergies by blood: gluten, eggs, dairy,                            casein, peanuts, lettuce                           Recent diagnoses of iritis and uveitis, now on                            Humira                           Cytopenias awaiting evaluation by                            hematologyPost-prandial abdominal pain/cramping and                            diarrhea Medicines:                See the Anesthesia note for documentation of the                            administered medications Procedure:                Pre-Anesthesia Assessment:                           - Prior to the procedure, a History and Physical                            was performed, and patient medications and                            allergies were reviewed. The patient's tolerance of                            previous anesthesia was also reviewed. The risks  and benefits of the procedure and the sedation                            options and risks were discussed with the patient.                            All questions were answered, and informed consent                            was obtained. Prior Anticoagulants: The patient has                            taken no previous anticoagulant or antiplatelet                            agents. ASA Grade Assessment: II - A patient with                            mild systemic disease. After reviewing the risks                             and benefits, the patient was deemed in                            satisfactory condition to undergo the procedure.                           After obtaining informed consent, the endoscope was                            passed under direct vision. Throughout the                            procedure, the patient's blood pressure, pulse, and                            oxygen saturations were monitored continuously. The                            Endoscope was introduced through the mouth, and                            advanced to the third part of duodenum. The upper                            GI endoscopy was accomplished without difficulty.                            The patient tolerated the procedure well. Scope In: Scope Out: Findings:                 The examined esophagus was normal.  Multiple localized, medium non-bleeding erosions                            were found in the gastric antrum. There were no                            stigmata of recent bleeding. Biopsies were taken                            with a cold forceps for histology.                           The examined duodenum was normal. Biopsies for                            histology were taken with a cold forceps for                            evaluation of celiac disease. Estimated blood loss                            was minimal.                           The cardia and gastric fundus were normal on                            retroflexion.                           The exam was otherwise without abnormality. Complications:            No immediate complications. Estimated blood loss:                            Minimal. Impression:               - Normal esophagus.                           - Non-bleeding erosive gastropathy. Biopsied.                           - Normal examined duodenum. Biopsied.                           - The examination was otherwise  normal. Recommendation:           - Patient has a contact number available for                            emergencies. The signs and symptoms of potential                            delayed complications were discussed with the  patient. Return to normal activities tomorrow.                            Written discharge instructions were provided to the                            patient.                           - Resume regular diet today.                           - Continue present medications.                           - Pantoprazole 40 mg twice daily for 8 weeks.                           - Await pathology results.                           - Avoid all NSAIDs.                           - Repeat upper endoscopy is not planned at this                            time.                           - Proceed with colonoscopy today as previously                            planned.                           - Return to GI clinic in 2-3 weeks (may be virtual                            encounter). Thornton Park MD, MD 02/08/2019 9:29:12 AM This report has been signed electronically.

## 2019-02-08 NOTE — Progress Notes (Signed)
Called to room to assist during endoscopic procedure.  Patient ID and intended procedure confirmed with present staff. Received instructions for my participation in the procedure from the performing physician.  

## 2019-02-08 NOTE — Op Note (Signed)
[facility] Patient Name: Vickie Ross Procedure Date: 02/08/2019 8:43 AM MRN: [id number] Endoscopist: Thornton Park MD, MD Age: 53 Referring MD:  Date of Birth: Feb 08, 1966 Gender: Female Account #: [id number] Procedure:                Colonoscopy Indications:              Generalized abdominal pain, Clinically significant                            diarrhea of unexplained origin                           Generalized abdominal pain                           Post-prandial abdominal pain/cramping and diarrhea                           History of H. Pylori                           History of Giardia                           Food allergies by blood: gluten, eggs, dairy,                            casein, peanuts, lettuce                           Recent diagnoses of iritis and uveitis, now on                            Humira                           Cytopenias awaiting evaluation by                            hematologyPost-prandial abdominal pain/cramping and                            diarrhea                           Paternal grandmother with colon cancer at an                            advanced age. No other known family history of                            colon cancer or polyps. Medicines:                See the Anesthesia note for documentation of the                            administered medications Procedure:  Pre-Anesthesia Assessment:                           - Prior to the procedure, a History and Physical                            was performed, and patient medications and                            allergies were reviewed. The patient's tolerance of                            previous anesthesia was also reviewed. The risks                            and benefits of the procedure and the sedation                            options and risks were discussed with the patient.                            All questions were answered, and  informed consent                            was obtained. Prior Anticoagulants: The patient has                            taken no previous anticoagulant or antiplatelet                            agents. ASA Grade Assessment: II - A patient with                            mild systemic disease. After reviewing the risks                            and benefits, the patient was deemed in                            satisfactory condition to undergo the procedure.                           After obtaining informed consent, the colonoscope                            was passed under direct vision. Throughout the                            procedure, the patient's blood pressure, pulse, and                            oxygen saturations were monitored continuously. The  Colonoscope was introduced through the anus and                            advanced to the the terminal ileum, with                            identification of the appendiceal orifice and IC                            valve. The colonoscopy was performed without                            difficulty. The patient tolerated the procedure                            well. The quality of the bowel preparation was                            good. The terminal ileum, ileocecal valve,                            appendiceal orifice, and rectum were photographed. Scope In: 9:07:19 AM Scope Out: 9:22:25 AM Scope Withdrawal Time: 0 hours 11 minutes 21 seconds  Total Procedure Duration: 0 hours 15 minutes 6 seconds  Findings:                 Skin tags were found on perianal exam.                           A 2 mm possible polyp was found in the ascending                            colon. The polyp was sessile. The polyp was removed                            with a cold biopsy forceps. Resection and retrieval                            were complete. Estimated blood loss was minimal.                           The  colon (entire examined portion) appeared normal                            except for some scatter diverticulosis in the                            sigmoid colon. Biopsies for histology were taken                            with a cold forceps from the entire colon for  evaluation of microscopic colitis. Estimated blood                            loss was minimal.                           The terminal ileum was mildly erythematous.                            Biopsies were taken with a cold forceps for                            histology. Estimated blood loss was minimal.                           Non-bleeding internal hemorrhoids were found during                            retroflexion. The hemorrhoids were small.                           The exam was otherwise without abnormality on                            direct and retroflexion views. Complications:            No immediate complications. Estimated blood loss:                            Minimal. Impression:               - Perianal skin tags found on perianal exam.                           - One 2 mm possible polyp in the ascending colon,                            removed with a cold biopsy forceps. Resected and                            retrieved.                           - Mild sigmoid diverticulosis.                           - The entire examined colonic mucosa is normal.                            Biopsied for colitis.                           - Mild erythematous mucosa in the terminal ileum.                            This is of unclear clinicaly significance. Changes  could be related to medications. Biopsied.                           - Non-bleeding internal hemorrhoids.                           - The examination was otherwise normal on direct                            and retroflexion views. Recommendation:           - Patient has a contact number available for                             emergencies. The signs and symptoms of potential                            delayed complications were discussed with the                            patient. Return to normal activities tomorrow.                            Written discharge instructions were provided to the                            patient.                           - Resume regular diet today.                           - Continue present medications. Avoid all NSAIDs.                           - Await pathology results.                           - Repeat colonoscopy date to be determined after                            pending pathology results are reviewed for                            surveillance based on pathology results.                           - Return to GI office in 2-4 weeks to review these                            results (may be a virtual encounter). Thornton Park MD, MD 02/08/2019 9:35:26 AM This report has been signed electronically.

## 2019-02-12 ENCOUNTER — Telehealth: Payer: Self-pay | Admitting: *Deleted

## 2019-02-12 ENCOUNTER — Encounter: Payer: Self-pay | Admitting: Gastroenterology

## 2019-02-12 NOTE — Telephone Encounter (Signed)
Second attempt, left VM.  

## 2019-02-12 NOTE — Telephone Encounter (Signed)
First follow up call attempt.  Reached voicemail with phone number identified.  Message left to call if any questions or concerns.

## 2019-02-13 ENCOUNTER — Encounter: Payer: Self-pay | Admitting: *Deleted

## 2019-02-13 ENCOUNTER — Telehealth: Payer: Self-pay | Admitting: *Deleted

## 2019-02-13 NOTE — Telephone Encounter (Signed)
Called the patient to schedule virtual follow up appointment with Dr. Tarri Glenn. No answer, sent message in [place]. Appointment scheduled on 02/26/2019 at 2:30 pm.

## 2019-02-14 ENCOUNTER — Telehealth: Payer: Self-pay | Admitting: Gastroenterology

## 2019-02-14 ENCOUNTER — Telehealth: Payer: Self-pay | Admitting: *Deleted

## 2019-02-14 NOTE — Telephone Encounter (Signed)
Left message for patient about upcoming virtual visit with Dr. Tarri Glenn concerning UGI and colonoscopy.

## 2019-02-14 NOTE — Telephone Encounter (Signed)
As long as her insurance company allows a telephone encounter, that would be fine with me.

## 2019-02-14 NOTE — Telephone Encounter (Signed)
Spoke with the patient who called, had a colon and UGI on 02/08/19 and wanted to notify Dr. Tarri Glenn that she wants to have a telephone call instead of having a virtual visit on 6/15 at 2:30 pm. The patient explained she has returned to work and works past 6 pm every night and has no time to take off (due to [place]) to go home to use the computer for a virtual visit or if this is not possible, for all results to be released to [employer].

## 2019-02-14 NOTE — Telephone Encounter (Signed)
Pt requested a call to discuss results of colonoscopy.

## 2019-02-20 ENCOUNTER — Telehealth: Payer: Self-pay | Admitting: *Deleted

## 2019-02-20 NOTE — Telephone Encounter (Signed)
Spoke to the patient who requested an in person f/u OV with Dr. Tarri Glenn to discuss results of colon and UGI. Patient had additional questions concerning results. Patient requested appointment be rescheduled on Friday (the patient is off on Friday afternoons). Nothing further at the time of the phone call.

## 2019-02-26 ENCOUNTER — Ambulatory Visit: Payer: PRIVATE HEALTH INSURANCE | Admitting: Gastroenterology

## 2019-03-23 ENCOUNTER — Ambulatory Visit: Payer: PRIVATE HEALTH INSURANCE | Admitting: Gastroenterology

## 2019-06-27 ENCOUNTER — Other Ambulatory Visit: Payer: Self-pay | Admitting: Internal Medicine

## 2019-06-27 DIAGNOSIS — Z1231 Encounter for screening mammogram for malignant neoplasm of breast: Secondary | ICD-10-CM

## 2019-10-05 ENCOUNTER — Ambulatory Visit: Payer: PRIVATE HEALTH INSURANCE

## 2019-10-18 ENCOUNTER — Encounter: Payer: Self-pay | Admitting: Physical Therapy

## 2019-10-18 ENCOUNTER — Ambulatory Visit: Payer: PRIVATE HEALTH INSURANCE | Admitting: Physical Therapy

## 2019-10-18 ENCOUNTER — Other Ambulatory Visit: Payer: Self-pay

## 2019-10-18 DIAGNOSIS — G44321 Chronic post-traumatic headache, intractable: Secondary | ICD-10-CM

## 2019-10-18 DIAGNOSIS — R42 Dizziness and giddiness: Secondary | ICD-10-CM | POA: Insufficient documentation

## 2019-10-18 DIAGNOSIS — M25561 Pain in right knee: Secondary | ICD-10-CM | POA: Insufficient documentation

## 2019-10-18 DIAGNOSIS — M542 Cervicalgia: Secondary | ICD-10-CM | POA: Insufficient documentation

## 2019-10-18 DIAGNOSIS — M79641 Pain in right hand: Secondary | ICD-10-CM | POA: Insufficient documentation

## 2019-10-18 DIAGNOSIS — R29898 Other symptoms and signs involving the musculoskeletal system: Secondary | ICD-10-CM

## 2019-10-18 DIAGNOSIS — R262 Difficulty in walking, not elsewhere classified: Secondary | ICD-10-CM | POA: Insufficient documentation

## 2019-10-18 DIAGNOSIS — M25562 Pain in left knee: Secondary | ICD-10-CM | POA: Insufficient documentation

## 2019-10-18 DIAGNOSIS — M6281 Muscle weakness (generalized): Secondary | ICD-10-CM | POA: Insufficient documentation
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
## 2019-10-22 ENCOUNTER — Ambulatory Visit: Payer: PRIVATE HEALTH INSURANCE | Admitting: Physical Therapy

## 2019-10-22 ENCOUNTER — Other Ambulatory Visit: Payer: Self-pay

## 2019-10-22 ENCOUNTER — Ambulatory Visit: Payer: PRIVATE HEALTH INSURANCE | Attending: Orthopaedic Surgery | Admitting: Physical Therapy

## 2019-10-22 ENCOUNTER — Encounter: Payer: Self-pay | Admitting: Physical Therapy

## 2019-10-22 DIAGNOSIS — R262 Difficulty in walking, not elsewhere classified: Secondary | ICD-10-CM | POA: Insufficient documentation

## 2019-10-22 DIAGNOSIS — M25562 Pain in left knee: Secondary | ICD-10-CM | POA: Diagnosis not present

## 2019-10-22 DIAGNOSIS — M25561 Pain in right knee: Secondary | ICD-10-CM | POA: Insufficient documentation

## 2019-10-22 DIAGNOSIS — R29898 Other symptoms and signs involving the musculoskeletal system: Secondary | ICD-10-CM

## 2019-10-22 DIAGNOSIS — M542 Cervicalgia: Secondary | ICD-10-CM

## 2019-10-22 DIAGNOSIS — R42 Dizziness and giddiness: Secondary | ICD-10-CM

## 2019-10-22 DIAGNOSIS — G44321 Chronic post-traumatic headache, intractable: Secondary | ICD-10-CM

## 2019-10-22 DIAGNOSIS — M6281 Muscle weakness (generalized): Secondary | ICD-10-CM | POA: Insufficient documentation
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
## 2019-10-26 ENCOUNTER — Other Ambulatory Visit: Payer: Self-pay

## 2019-10-26 ENCOUNTER — Encounter: Payer: Self-pay | Admitting: Physical Therapy

## 2019-10-26 ENCOUNTER — Ambulatory Visit: Payer: PRIVATE HEALTH INSURANCE | Admitting: Physical Therapy

## 2019-10-26 DIAGNOSIS — M25562 Pain in left knee: Secondary | ICD-10-CM | POA: Diagnosis not present

## 2019-10-26 DIAGNOSIS — M25561 Pain in right knee: Secondary | ICD-10-CM

## 2019-10-26 DIAGNOSIS — R42 Dizziness and giddiness: Secondary | ICD-10-CM

## 2019-10-26 DIAGNOSIS — R29898 Other symptoms and signs involving the musculoskeletal system: Secondary | ICD-10-CM

## 2019-10-26 DIAGNOSIS — G44321 Chronic post-traumatic headache, intractable: Secondary | ICD-10-CM

## 2019-10-26 DIAGNOSIS — M542 Cervicalgia: Secondary | ICD-10-CM

## 2019-10-26 DIAGNOSIS — M6281 Muscle weakness (generalized): Secondary | ICD-10-CM

## 2019-10-26 DIAGNOSIS — R262 Difficulty in walking, not elsewhere classified: Secondary | ICD-10-CM
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
## 2019-10-29 ENCOUNTER — Encounter: Payer: Self-pay | Admitting: Physical Therapy

## 2019-10-29 ENCOUNTER — Other Ambulatory Visit: Payer: Self-pay

## 2019-10-29 ENCOUNTER — Ambulatory Visit: Payer: PRIVATE HEALTH INSURANCE | Admitting: Physical Therapy

## 2019-10-29 DIAGNOSIS — M25562 Pain in left knee: Secondary | ICD-10-CM

## 2019-10-29 DIAGNOSIS — M25561 Pain in right knee: Secondary | ICD-10-CM

## 2019-10-29 DIAGNOSIS — R42 Dizziness and giddiness: Secondary | ICD-10-CM

## 2019-10-29 DIAGNOSIS — M79641 Pain in right hand: Secondary | ICD-10-CM

## 2019-10-29 DIAGNOSIS — M542 Cervicalgia: Secondary | ICD-10-CM

## 2019-10-29 DIAGNOSIS — G44321 Chronic post-traumatic headache, intractable: Secondary | ICD-10-CM

## 2019-10-29 DIAGNOSIS — M6281 Muscle weakness (generalized): Secondary | ICD-10-CM

## 2019-10-29 DIAGNOSIS — R29898 Other symptoms and signs involving the musculoskeletal system: Secondary | ICD-10-CM

## 2019-10-29 DIAGNOSIS — R262 Difficulty in walking, not elsewhere classified: Secondary | ICD-10-CM
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
## 2019-11-01 ENCOUNTER — Ambulatory Visit: Payer: PRIVATE HEALTH INSURANCE | Admitting: Physical Therapy

## 2019-11-05 ENCOUNTER — Other Ambulatory Visit: Payer: Self-pay

## 2019-11-05 ENCOUNTER — Ambulatory Visit: Payer: PRIVATE HEALTH INSURANCE | Admitting: Physical Therapy

## 2019-11-05 ENCOUNTER — Encounter: Payer: Self-pay | Admitting: Physical Therapy

## 2019-11-05 DIAGNOSIS — M25562 Pain in left knee: Secondary | ICD-10-CM | POA: Diagnosis not present

## 2019-11-05 DIAGNOSIS — M25561 Pain in right knee: Secondary | ICD-10-CM

## 2019-11-05 DIAGNOSIS — R262 Difficulty in walking, not elsewhere classified: Secondary | ICD-10-CM

## 2019-11-05 DIAGNOSIS — M79641 Pain in right hand: Secondary | ICD-10-CM

## 2019-11-05 DIAGNOSIS — M542 Cervicalgia: Secondary | ICD-10-CM

## 2019-11-05 DIAGNOSIS — M6281 Muscle weakness (generalized): Secondary | ICD-10-CM

## 2019-11-05 DIAGNOSIS — R42 Dizziness and giddiness: Secondary | ICD-10-CM

## 2019-11-05 DIAGNOSIS — G44321 Chronic post-traumatic headache, intractable: Secondary | ICD-10-CM

## 2019-11-05 DIAGNOSIS — R29898 Other symptoms and signs involving the musculoskeletal system: Secondary | ICD-10-CM

## 2019-11-05 NOTE — Patient Instructions (Signed)
   Kinesiology tape  What is kinesiology tape?  There are many brands of kinesiology tape. KTape, Rock Tape, Body Sport, Dynamic tape, to name a few.  It is an elasticized tape designed to support the body's natural healing process. This tape provides stability and support to muscles and joints without restricting motion.  It can also help decrease swelling in the area of application.  How does it work?  The tape microscopically lifts and decompresses the skin to allow for drainage of lymph (swelling) to flow away from area, reducing inflammation. The tape has the ability to help re-educate the neuromuscular system by targeting specific receptors in the skin. The presence of the tape increases the body's awareness of posture and body mechanics.  Do not use with:  . Open wounds . Skin lesions . Adhesive allergies  In some rare cases, mild/moderate skin irritation can occur. This can include redness, itchiness, or hives. If this occurs, immediately remove tape and consult your primary care physician if symptoms are severe or do not resolve within 2 days.  Safe removal of the tape:  To remove tape safely, hold nearby skin with one hand and gentle roll tape down with other hand. You can apply oil or conditioner to tape while in shower prior to removal to loosen adhesive. DO NOT swiftly rip tape off like a band-aid, as this could cause skin tears and additional skin irritation.     For questions, please contact your therapist at:  [facility] [address]  [address] [place], [place], [address] Phone: [phone]   Fax:  [phone]     
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
## 2019-11-09 ENCOUNTER — Encounter: Payer: Self-pay | Admitting: Physical Therapy

## 2019-11-09 ENCOUNTER — Ambulatory Visit: Payer: PRIVATE HEALTH INSURANCE | Admitting: Physical Therapy

## 2019-11-09 ENCOUNTER — Other Ambulatory Visit: Payer: Self-pay

## 2019-11-09 DIAGNOSIS — R29898 Other symptoms and signs involving the musculoskeletal system: Secondary | ICD-10-CM

## 2019-11-09 DIAGNOSIS — M6281 Muscle weakness (generalized): Secondary | ICD-10-CM

## 2019-11-09 DIAGNOSIS — M79641 Pain in right hand: Secondary | ICD-10-CM

## 2019-11-09 DIAGNOSIS — R262 Difficulty in walking, not elsewhere classified: Secondary | ICD-10-CM

## 2019-11-09 DIAGNOSIS — G44321 Chronic post-traumatic headache, intractable: Secondary | ICD-10-CM

## 2019-11-09 DIAGNOSIS — M25562 Pain in left knee: Secondary | ICD-10-CM | POA: Diagnosis not present

## 2019-11-09 DIAGNOSIS — R42 Dizziness and giddiness: Secondary | ICD-10-CM

## 2019-11-09 DIAGNOSIS — M25561 Pain in right knee: Secondary | ICD-10-CM

## 2019-11-09 DIAGNOSIS — M542 Cervicalgia: Secondary | ICD-10-CM

## 2019-11-09 NOTE — Patient Instructions (Signed)
   Sitting propping on R/L forearm + gaze stabilization 2x10, 2x/day STS + gaze stabilization 2x10
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
## 2019-11-12 ENCOUNTER — Ambulatory Visit: Payer: PRIVATE HEALTH INSURANCE | Attending: Orthopedic Surgery | Admitting: Physical Therapy

## 2019-11-12 ENCOUNTER — Encounter: Payer: Self-pay | Admitting: Physical Therapy

## 2019-11-12 ENCOUNTER — Other Ambulatory Visit: Payer: Self-pay

## 2019-11-12 DIAGNOSIS — M542 Cervicalgia: Secondary | ICD-10-CM | POA: Insufficient documentation

## 2019-11-12 DIAGNOSIS — R262 Difficulty in walking, not elsewhere classified: Secondary | ICD-10-CM | POA: Diagnosis present

## 2019-11-12 DIAGNOSIS — M25562 Pain in left knee: Secondary | ICD-10-CM | POA: Diagnosis present

## 2019-11-12 DIAGNOSIS — R29898 Other symptoms and signs involving the musculoskeletal system: Secondary | ICD-10-CM | POA: Diagnosis present

## 2019-11-12 DIAGNOSIS — M79641 Pain in right hand: Secondary | ICD-10-CM | POA: Diagnosis present

## 2019-11-12 DIAGNOSIS — R42 Dizziness and giddiness: Secondary | ICD-10-CM | POA: Diagnosis present

## 2019-11-12 DIAGNOSIS — M6281 Muscle weakness (generalized): Secondary | ICD-10-CM | POA: Diagnosis present

## 2019-11-12 DIAGNOSIS — G44321 Chronic post-traumatic headache, intractable: Secondary | ICD-10-CM

## 2019-11-12 DIAGNOSIS — M25561 Pain in right knee: Secondary | ICD-10-CM | POA: Diagnosis present
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
## 2019-11-12 NOTE — Patient Instructions (Signed)

## 2019-11-14 ENCOUNTER — Encounter: Payer: Self-pay | Admitting: Physical Therapy

## 2019-11-14 ENCOUNTER — Ambulatory Visit: Payer: PRIVATE HEALTH INSURANCE | Admitting: Physical Therapy

## 2019-11-14 ENCOUNTER — Other Ambulatory Visit: Payer: Self-pay

## 2019-11-14 DIAGNOSIS — M6281 Muscle weakness (generalized): Secondary | ICD-10-CM

## 2019-11-14 DIAGNOSIS — M25561 Pain in right knee: Secondary | ICD-10-CM

## 2019-11-14 DIAGNOSIS — R262 Difficulty in walking, not elsewhere classified: Secondary | ICD-10-CM

## 2019-11-14 DIAGNOSIS — M25562 Pain in left knee: Secondary | ICD-10-CM | POA: Diagnosis not present

## 2019-11-14 DIAGNOSIS — G44321 Chronic post-traumatic headache, intractable: Secondary | ICD-10-CM

## 2019-11-14 DIAGNOSIS — M79641 Pain in right hand: Secondary | ICD-10-CM

## 2019-11-14 DIAGNOSIS — M542 Cervicalgia: Secondary | ICD-10-CM

## 2019-11-14 DIAGNOSIS — R42 Dizziness and giddiness: Secondary | ICD-10-CM

## 2019-11-14 DIAGNOSIS — R29898 Other symptoms and signs involving the musculoskeletal system: Secondary | ICD-10-CM
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
## 2019-11-16 ENCOUNTER — Other Ambulatory Visit: Payer: Self-pay

## 2019-11-16 ENCOUNTER — Ambulatory Visit (INDEPENDENT_AMBULATORY_CARE_PROVIDER_SITE_OTHER): Payer: No Typology Code available for payment source | Admitting: Psychiatry

## 2019-11-16 DIAGNOSIS — F4310 Post-traumatic stress disorder, unspecified: Secondary | ICD-10-CM
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
## 2019-11-17 ENCOUNTER — Encounter: Payer: Self-pay | Admitting: Psychiatry

## 2019-11-19 ENCOUNTER — Encounter: Payer: Self-pay | Admitting: Physical Therapy

## 2019-11-19 ENCOUNTER — Other Ambulatory Visit: Payer: Self-pay

## 2019-11-19 ENCOUNTER — Ambulatory Visit: Payer: PRIVATE HEALTH INSURANCE | Admitting: Physical Therapy

## 2019-11-19 DIAGNOSIS — M25562 Pain in left knee: Secondary | ICD-10-CM | POA: Diagnosis not present

## 2019-11-19 DIAGNOSIS — R29898 Other symptoms and signs involving the musculoskeletal system: Secondary | ICD-10-CM

## 2019-11-19 DIAGNOSIS — R42 Dizziness and giddiness: Secondary | ICD-10-CM

## 2019-11-19 DIAGNOSIS — M542 Cervicalgia: Secondary | ICD-10-CM

## 2019-11-19 DIAGNOSIS — M6281 Muscle weakness (generalized): Secondary | ICD-10-CM

## 2019-11-19 DIAGNOSIS — G44321 Chronic post-traumatic headache, intractable: Secondary | ICD-10-CM

## 2019-11-19 DIAGNOSIS — R262 Difficulty in walking, not elsewhere classified: Secondary | ICD-10-CM

## 2019-11-19 DIAGNOSIS — M25561 Pain in right knee: Secondary | ICD-10-CM

## 2019-11-19 DIAGNOSIS — M79641 Pain in right hand: Secondary | ICD-10-CM
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
## 2019-11-21 ENCOUNTER — Other Ambulatory Visit: Payer: Self-pay

## 2019-11-21 ENCOUNTER — Encounter: Payer: Self-pay | Admitting: Physical Therapy

## 2019-11-21 ENCOUNTER — Ambulatory Visit: Payer: PRIVATE HEALTH INSURANCE | Admitting: Physical Therapy

## 2019-11-21 DIAGNOSIS — R262 Difficulty in walking, not elsewhere classified: Secondary | ICD-10-CM

## 2019-11-21 DIAGNOSIS — M25562 Pain in left knee: Secondary | ICD-10-CM

## 2019-11-21 DIAGNOSIS — R29898 Other symptoms and signs involving the musculoskeletal system: Secondary | ICD-10-CM

## 2019-11-21 DIAGNOSIS — R42 Dizziness and giddiness: Secondary | ICD-10-CM

## 2019-11-21 DIAGNOSIS — M542 Cervicalgia: Secondary | ICD-10-CM

## 2019-11-21 DIAGNOSIS — G44321 Chronic post-traumatic headache, intractable: Secondary | ICD-10-CM

## 2019-11-21 DIAGNOSIS — M25561 Pain in right knee: Secondary | ICD-10-CM

## 2019-11-21 DIAGNOSIS — M79641 Pain in right hand: Secondary | ICD-10-CM

## 2019-11-21 DIAGNOSIS — M6281 Muscle weakness (generalized): Secondary | ICD-10-CM
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
## 2019-11-27 ENCOUNTER — Other Ambulatory Visit: Payer: Self-pay

## 2019-11-27 ENCOUNTER — Ambulatory Visit (INDEPENDENT_AMBULATORY_CARE_PROVIDER_SITE_OTHER): Payer: No Typology Code available for payment source | Admitting: Psychiatry

## 2019-11-27 ENCOUNTER — Encounter: Payer: Self-pay | Admitting: Psychiatry

## 2019-11-27 DIAGNOSIS — F4310 Post-traumatic stress disorder, unspecified: Secondary | ICD-10-CM
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
## 2019-11-28 ENCOUNTER — Encounter: Payer: Self-pay | Admitting: Physical Therapy

## 2019-11-28 ENCOUNTER — Ambulatory Visit: Payer: PRIVATE HEALTH INSURANCE | Admitting: Physical Therapy

## 2019-11-28 DIAGNOSIS — R262 Difficulty in walking, not elsewhere classified: Secondary | ICD-10-CM

## 2019-11-28 DIAGNOSIS — G44321 Chronic post-traumatic headache, intractable: Secondary | ICD-10-CM

## 2019-11-28 DIAGNOSIS — M25562 Pain in left knee: Secondary | ICD-10-CM | POA: Diagnosis not present

## 2019-11-28 DIAGNOSIS — R29898 Other symptoms and signs involving the musculoskeletal system: Secondary | ICD-10-CM

## 2019-11-28 DIAGNOSIS — M6281 Muscle weakness (generalized): Secondary | ICD-10-CM

## 2019-11-28 DIAGNOSIS — M542 Cervicalgia: Secondary | ICD-10-CM

## 2019-11-28 DIAGNOSIS — M79641 Pain in right hand: Secondary | ICD-10-CM

## 2019-11-28 DIAGNOSIS — R42 Dizziness and giddiness: Secondary | ICD-10-CM

## 2019-11-28 DIAGNOSIS — M25561 Pain in right knee: Secondary | ICD-10-CM
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
## 2019-11-30 ENCOUNTER — Encounter: Payer: PRIVATE HEALTH INSURANCE | Admitting: Physical Therapy

## 2019-12-04 ENCOUNTER — Ambulatory Visit: Payer: PRIVATE HEALTH INSURANCE | Admitting: Physical Therapy

## 2019-12-04 ENCOUNTER — Other Ambulatory Visit: Payer: Self-pay

## 2019-12-04 ENCOUNTER — Encounter: Payer: Self-pay | Admitting: Physical Therapy

## 2019-12-04 DIAGNOSIS — M6281 Muscle weakness (generalized): Secondary | ICD-10-CM

## 2019-12-04 DIAGNOSIS — M25562 Pain in left knee: Secondary | ICD-10-CM | POA: Diagnosis not present

## 2019-12-04 DIAGNOSIS — M25561 Pain in right knee: Secondary | ICD-10-CM

## 2019-12-04 DIAGNOSIS — R262 Difficulty in walking, not elsewhere classified: Secondary | ICD-10-CM

## 2019-12-04 DIAGNOSIS — G44321 Chronic post-traumatic headache, intractable: Secondary | ICD-10-CM

## 2019-12-04 DIAGNOSIS — M542 Cervicalgia: Secondary | ICD-10-CM

## 2019-12-04 DIAGNOSIS — R42 Dizziness and giddiness: Secondary | ICD-10-CM

## 2019-12-04 DIAGNOSIS — M79641 Pain in right hand: Secondary | ICD-10-CM

## 2019-12-04 DIAGNOSIS — R29898 Other symptoms and signs involving the musculoskeletal system: Secondary | ICD-10-CM
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
## 2019-12-06 ENCOUNTER — Ambulatory Visit: Payer: PRIVATE HEALTH INSURANCE | Admitting: Physical Therapy

## 2019-12-06 ENCOUNTER — Encounter: Payer: Self-pay | Admitting: Physical Therapy

## 2019-12-06 ENCOUNTER — Other Ambulatory Visit: Payer: Self-pay

## 2019-12-06 DIAGNOSIS — M25561 Pain in right knee: Secondary | ICD-10-CM

## 2019-12-06 DIAGNOSIS — M542 Cervicalgia: Secondary | ICD-10-CM

## 2019-12-06 DIAGNOSIS — M6281 Muscle weakness (generalized): Secondary | ICD-10-CM

## 2019-12-06 DIAGNOSIS — M25562 Pain in left knee: Secondary | ICD-10-CM

## 2019-12-06 DIAGNOSIS — R262 Difficulty in walking, not elsewhere classified: Secondary | ICD-10-CM

## 2019-12-06 DIAGNOSIS — R42 Dizziness and giddiness: Secondary | ICD-10-CM

## 2019-12-06 DIAGNOSIS — G44321 Chronic post-traumatic headache, intractable: Secondary | ICD-10-CM

## 2019-12-06 DIAGNOSIS — M79641 Pain in right hand: Secondary | ICD-10-CM

## 2019-12-06 DIAGNOSIS — R29898 Other symptoms and signs involving the musculoskeletal system: Secondary | ICD-10-CM
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
## 2019-12-07 ENCOUNTER — Ambulatory Visit (INDEPENDENT_AMBULATORY_CARE_PROVIDER_SITE_OTHER): Payer: No Typology Code available for payment source | Admitting: Psychiatry

## 2019-12-07 ENCOUNTER — Encounter: Payer: Self-pay | Admitting: Psychiatry

## 2019-12-07 DIAGNOSIS — F4310 Post-traumatic stress disorder, unspecified: Secondary | ICD-10-CM
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
## 2019-12-11 ENCOUNTER — Encounter: Payer: Self-pay | Admitting: Physical Therapy

## 2019-12-11 ENCOUNTER — Other Ambulatory Visit: Payer: Self-pay

## 2019-12-11 ENCOUNTER — Ambulatory Visit: Payer: PRIVATE HEALTH INSURANCE | Admitting: Physical Therapy

## 2019-12-11 ENCOUNTER — Ambulatory Visit: Payer: No Typology Code available for payment source | Admitting: Psychiatry

## 2019-12-11 DIAGNOSIS — M25561 Pain in right knee: Secondary | ICD-10-CM

## 2019-12-11 DIAGNOSIS — M25562 Pain in left knee: Secondary | ICD-10-CM

## 2019-12-11 DIAGNOSIS — M542 Cervicalgia: Secondary | ICD-10-CM

## 2019-12-11 DIAGNOSIS — M6281 Muscle weakness (generalized): Secondary | ICD-10-CM

## 2019-12-11 DIAGNOSIS — G44321 Chronic post-traumatic headache, intractable: Secondary | ICD-10-CM

## 2019-12-11 DIAGNOSIS — M79641 Pain in right hand: Secondary | ICD-10-CM

## 2019-12-11 DIAGNOSIS — R262 Difficulty in walking, not elsewhere classified: Secondary | ICD-10-CM

## 2019-12-11 DIAGNOSIS — R42 Dizziness and giddiness: Secondary | ICD-10-CM

## 2019-12-11 DIAGNOSIS — R29898 Other symptoms and signs involving the musculoskeletal system: Secondary | ICD-10-CM
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
## 2019-12-13 ENCOUNTER — Other Ambulatory Visit: Payer: Self-pay

## 2019-12-13 ENCOUNTER — Encounter: Payer: Self-pay | Admitting: Physical Therapy

## 2019-12-13 ENCOUNTER — Ambulatory Visit: Payer: PRIVATE HEALTH INSURANCE | Attending: Orthopaedic Surgery | Admitting: Physical Therapy

## 2019-12-13 DIAGNOSIS — R29898 Other symptoms and signs involving the musculoskeletal system: Secondary | ICD-10-CM | POA: Diagnosis present

## 2019-12-13 DIAGNOSIS — R262 Difficulty in walking, not elsewhere classified: Secondary | ICD-10-CM | POA: Diagnosis present

## 2019-12-13 DIAGNOSIS — M25562 Pain in left knee: Secondary | ICD-10-CM | POA: Insufficient documentation

## 2019-12-13 DIAGNOSIS — R42 Dizziness and giddiness: Secondary | ICD-10-CM

## 2019-12-13 DIAGNOSIS — M542 Cervicalgia: Secondary | ICD-10-CM | POA: Insufficient documentation

## 2019-12-13 DIAGNOSIS — M25561 Pain in right knee: Secondary | ICD-10-CM | POA: Diagnosis present

## 2019-12-13 DIAGNOSIS — M6281 Muscle weakness (generalized): Secondary | ICD-10-CM

## 2019-12-13 DIAGNOSIS — G44321 Chronic post-traumatic headache, intractable: Secondary | ICD-10-CM | POA: Diagnosis present

## 2019-12-13 DIAGNOSIS — M79641 Pain in right hand: Secondary | ICD-10-CM | POA: Diagnosis present
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
## 2019-12-18 ENCOUNTER — Ambulatory Visit: Payer: PRIVATE HEALTH INSURANCE | Admitting: Physical Therapy

## 2019-12-20 ENCOUNTER — Ambulatory Visit: Payer: PRIVATE HEALTH INSURANCE | Admitting: Physical Therapy

## 2019-12-25 ENCOUNTER — Encounter: Payer: PRIVATE HEALTH INSURANCE | Admitting: Physical Therapy

## 2019-12-27 ENCOUNTER — Encounter: Payer: PRIVATE HEALTH INSURANCE | Admitting: Physical Therapy

## 2019-12-28 ENCOUNTER — Encounter: Payer: Self-pay | Admitting: Psychiatry

## 2019-12-28 ENCOUNTER — Ambulatory Visit (INDEPENDENT_AMBULATORY_CARE_PROVIDER_SITE_OTHER): Payer: No Typology Code available for payment source | Admitting: Psychiatry

## 2019-12-28 ENCOUNTER — Ambulatory Visit
Admission: RE | Admit: 2019-12-28 | Discharge: 2019-12-28 | Disposition: A | Discharge status: Home / Self Care | Payer: PRIVATE HEALTH INSURANCE | Source: Ambulatory Visit | Attending: Internal Medicine | Admitting: Internal Medicine

## 2019-12-28 ENCOUNTER — Other Ambulatory Visit: Payer: Self-pay

## 2019-12-28 DIAGNOSIS — Z1231 Encounter for screening mammogram for malignant neoplasm of breast: Secondary | ICD-10-CM

## 2019-12-28 DIAGNOSIS — F4310 Post-traumatic stress disorder, unspecified: Secondary | ICD-10-CM
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
## 2020-01-11 ENCOUNTER — Ambulatory Visit (INDEPENDENT_AMBULATORY_CARE_PROVIDER_SITE_OTHER): Payer: No Typology Code available for payment source | Admitting: Psychiatry

## 2020-01-11 ENCOUNTER — Encounter: Payer: Self-pay | Admitting: Psychiatry

## 2020-01-11 ENCOUNTER — Other Ambulatory Visit: Payer: Self-pay

## 2020-01-11 DIAGNOSIS — F4310 Post-traumatic stress disorder, unspecified: Secondary | ICD-10-CM
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
## 2020-01-25 ENCOUNTER — Ambulatory Visit: Payer: No Typology Code available for payment source | Admitting: Psychiatry

## 2020-02-08 ENCOUNTER — Ambulatory Visit: Payer: No Typology Code available for payment source | Admitting: Psychiatry

## 2020-02-12 IMAGING — MG DIGITAL SCREENING BILATERAL MAMMOGRAM WITH IMPLANTS, CAD AND TOM
9 of 14 series · 9 of 34 positions shown · non-contrast
Comparison: Previous exam(s).

CLINICAL DATA: Screening.

EXAM:
DIGITAL SCREENING BILATERAL MAMMOGRAM WITH IMPLANTS, CAD AND TOMO
The patient has implants. Standard and implant displaced views were
performed.

[L CC]
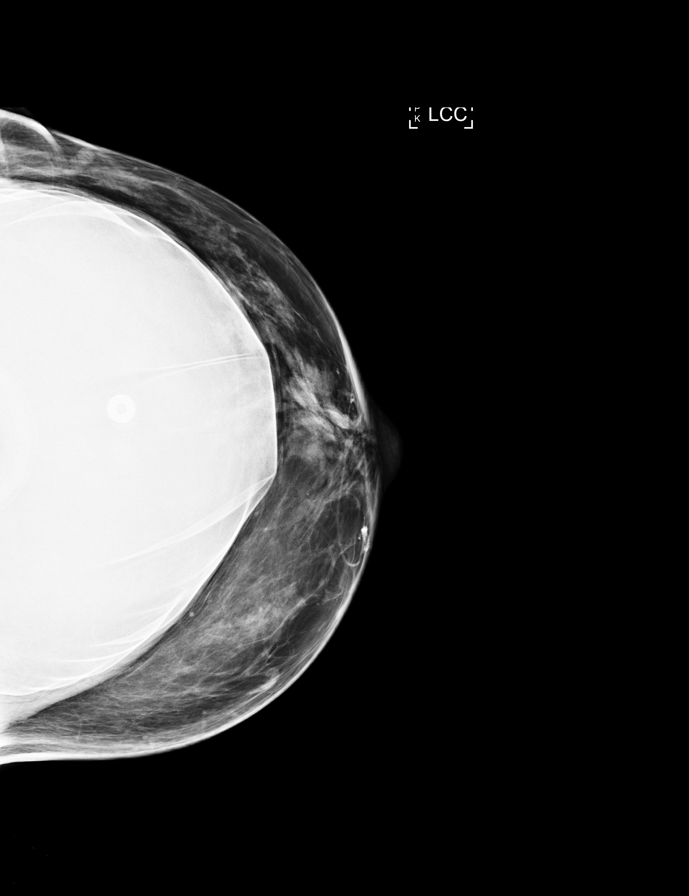

[R MLO]
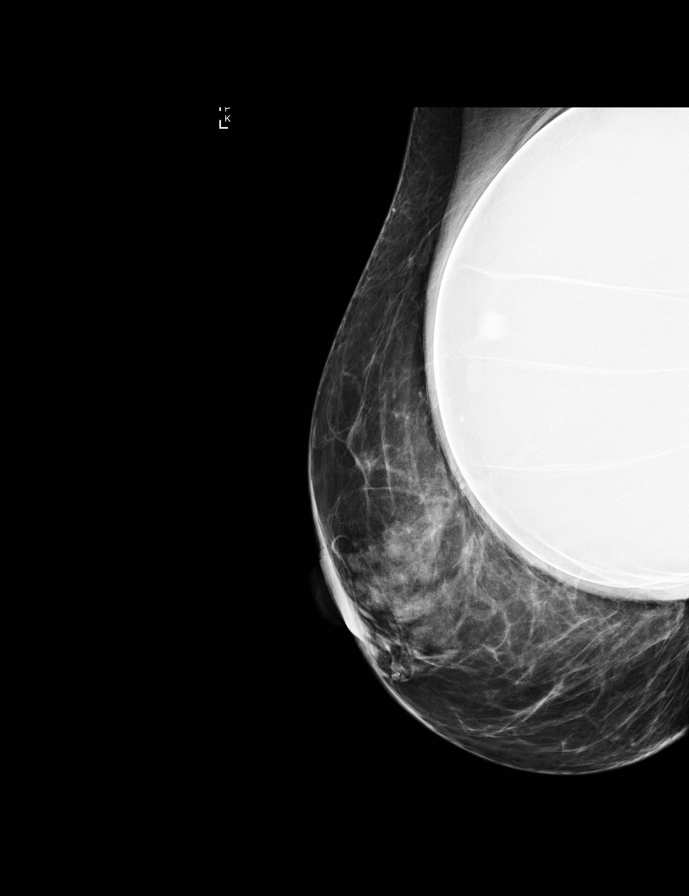

[L MLO]
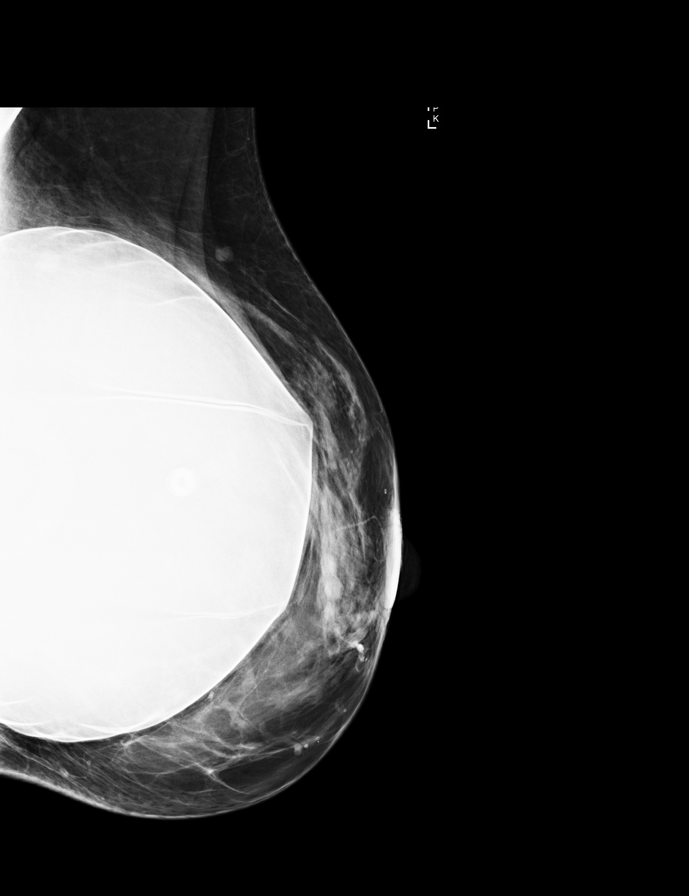

[R CC]
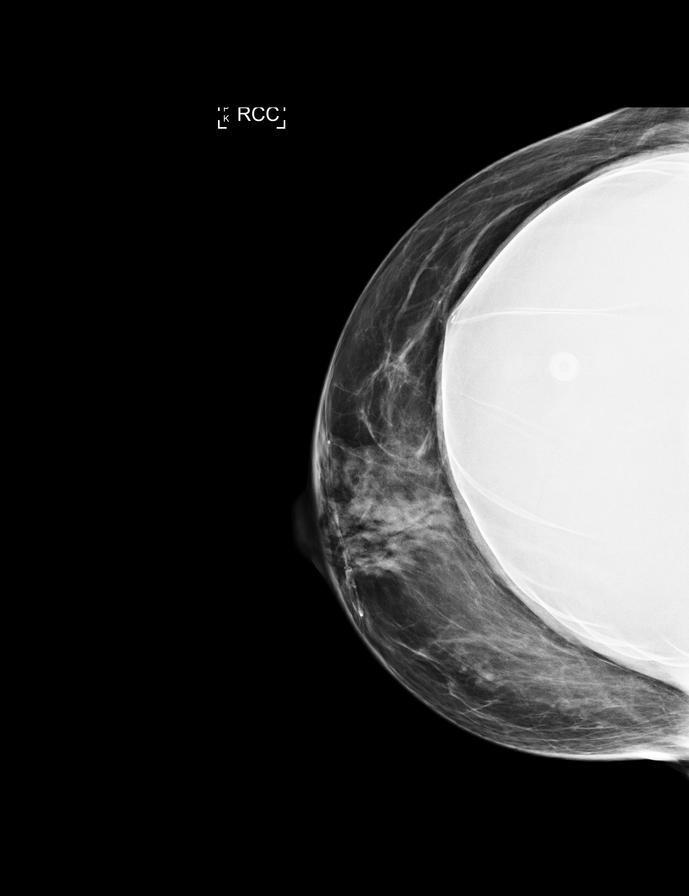

[L MLO synth-2D (1 of 2)]
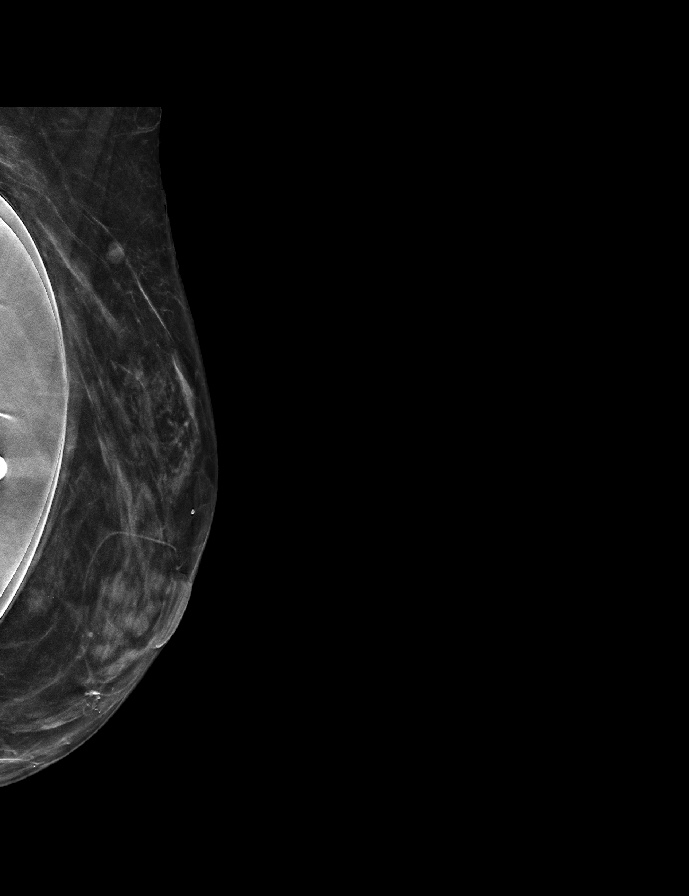

[R MLO synth-2D]
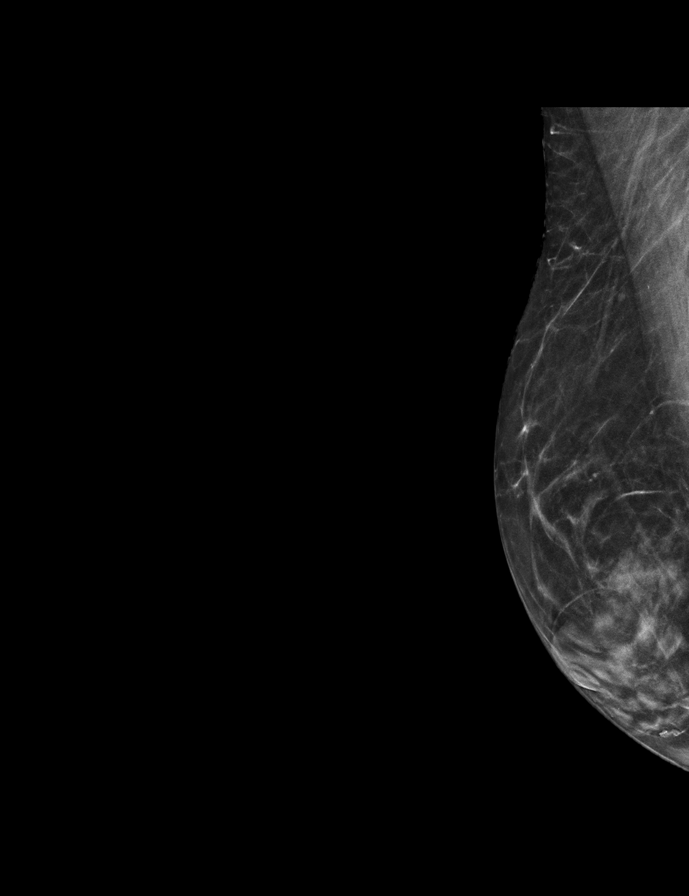

[R CC synth-2D]
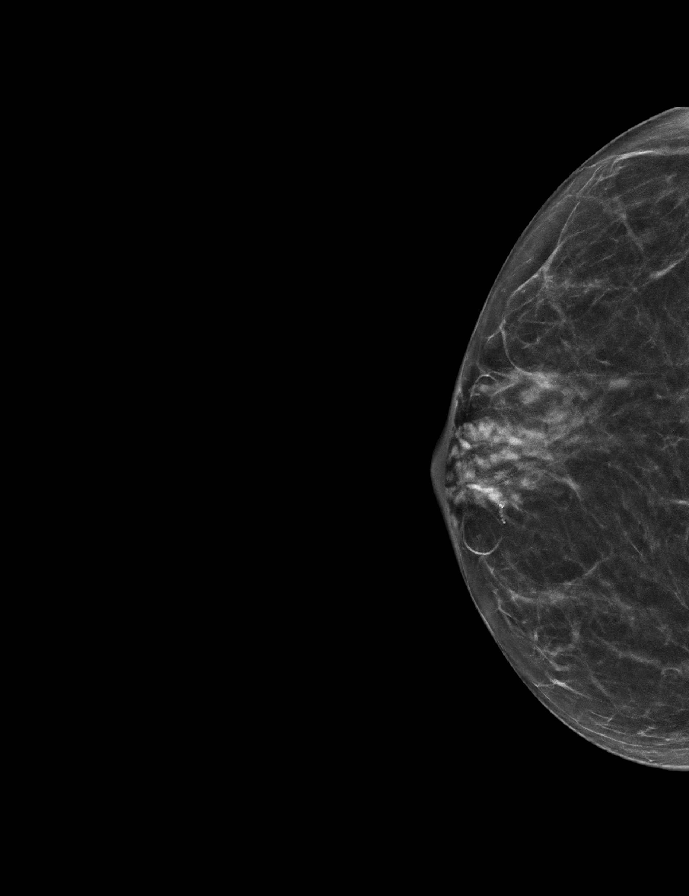

[L MLO synth-2D (2 of 2)]
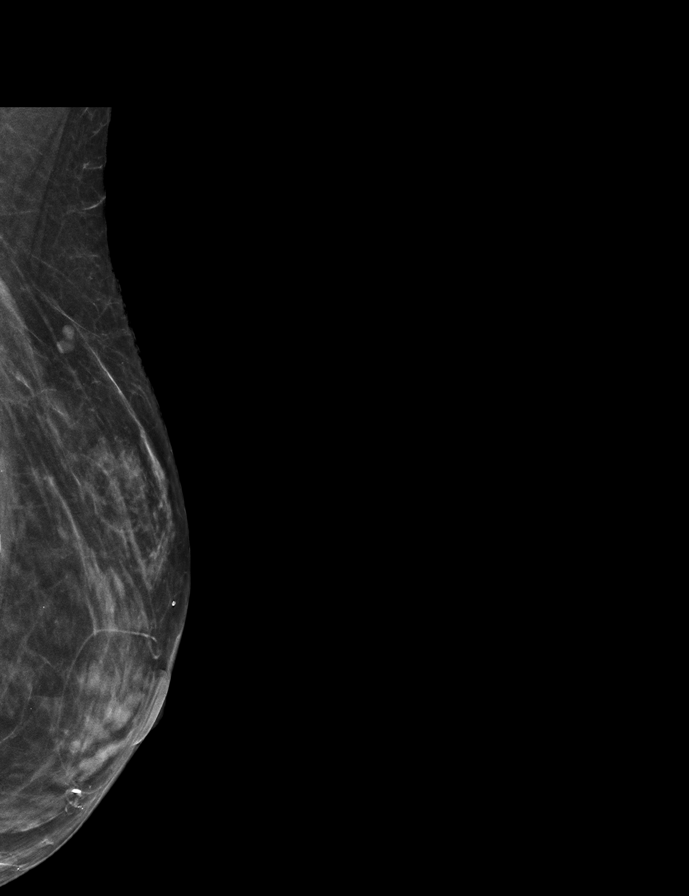

[L CC synth-2D]
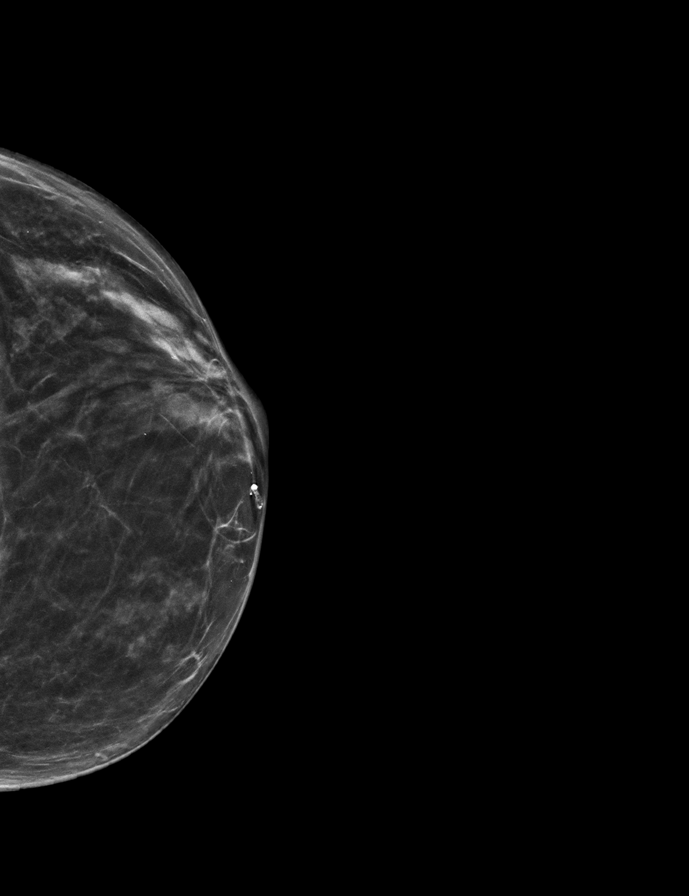

[9 of 34 positions shown; findings below may reference images not displayed]

ACR Breast Density Category b: There are scattered areas of
fibroglandular density.
FINDINGS: There are no findings suspicious for malignancy. Images were
processed with CAD.
IMPRESSION: No mammographic evidence of malignancy. A result letter of this
screening mammogram will be mailed directly to the patient.

RECOMMENDATION:
Screening mammogram in one year. (Code:WE-[id number])

BI-RADS CATEGORY  1:  Negative.

## 2020-07-28 ENCOUNTER — Other Ambulatory Visit: Payer: Self-pay

## 2020-07-28 ENCOUNTER — Ambulatory Visit: Payer: PRIVATE HEALTH INSURANCE | Attending: Plastic Surgery | Admitting: Physical Therapy

## 2020-07-28 DIAGNOSIS — R29898 Other symptoms and signs involving the musculoskeletal system: Secondary | ICD-10-CM | POA: Insufficient documentation

## 2020-07-28 DIAGNOSIS — M25631 Stiffness of right wrist, not elsewhere classified: Secondary | ICD-10-CM | POA: Diagnosis present

## 2020-07-28 DIAGNOSIS — M25642 Stiffness of left hand, not elsewhere classified: Secondary | ICD-10-CM

## 2020-07-28 DIAGNOSIS — M25641 Stiffness of right hand, not elsewhere classified: Secondary | ICD-10-CM | POA: Insufficient documentation

## 2020-07-28 DIAGNOSIS — M25632 Stiffness of left wrist, not elsewhere classified: Secondary | ICD-10-CM | POA: Insufficient documentation

## 2020-07-28 DIAGNOSIS — M25542 Pain in joints of left hand: Secondary | ICD-10-CM | POA: Insufficient documentation

## 2020-07-28 DIAGNOSIS — M25541 Pain in joints of right hand: Secondary | ICD-10-CM | POA: Insufficient documentation

## 2020-07-28 NOTE — Therapy (Signed)
[facility] [address]  [place], [place], [address] Phone: [phone]   Fax:  [phone]  Physical Therapy Evaluation  Patient Details  Name: Vickie Ross MRN: [id number] Date of Birth: 10-05-1965 Referring Provider (PT): Elvia Collum, MD   Encounter Date: 07/28/2020   PT End of Session - 07/28/20 1803    Visit Number 1    Number of Visits 13    Date for PT Re-Evaluation 09/08/20    PT Start Time 3810    PT Stop Time 1746    PT Time Calculation (min) 42 min    Activity Tolerance Patient tolerated treatment well    Behavior During Therapy [facility] for tasks assessed/performed           Past Medical History:  Diagnosis Date  . Allergic rhinitis   . Anxiety   . Asthma     Past Surgical History:  Procedure Laterality Date  . AUGMENTATION MAMMAPLASTY    . FOOT SURGERY Right 12/2013    There were no vitals filed for this visit.    Subjective Assessment - 07/28/20 1705    Subjective Patient reports undergoing R carpal tunnel release, trapeziectomy, and APL suspensionplasty on 01/23/20. Now no longer in a splint on this side, but notes that her MD advised her that she has scar tissue remaining in the R thumb which is restricting her motion. Has pain shooting down the R thumb when pushing with that hand or stretching. Still noting difficulty with opening a bottle of water. Reports that she had another surgery on the L hand on 06/30/20 and has been in a splint since and was instructed not to perform any active movement until she starts therapy. L thumb pain will occur spontaneously and radiate up the forearm.    Pertinent History osteopenia, C7/T1 compression fractures and ligamentous injury 2021, concussion    Limitations Lifting;Writing;House hold activities    Diagnostic tests none    Patient Stated Goals improve pain and mobility    Currently in Pain? No/denies    Pain Score 0-No pain    Pain Location --    thumb   Pain Orientation Right    Pain Descriptors / Indicators Sharp    Pain Type Chronic pain    Multiple Pain Sites Yes    Pain Score 0    Pain Location --   thumb   Pain Orientation Left    Pain Descriptors / Indicators Sharp    Pain Type Acute pain;Surgical pain              [facility] PT Assessment - 07/28/20 1718      Assessment   Medical Diagnosis [facility]    Referring Provider (PT) Elvia Collum, MD    Onset Date/Surgical Date 06/30/20    Hand Dominance Right    Next MD Visit 08/12/20    Prior Therapy yes      Precautions   Precautions --   L hand in splint     Balance Screen   Has the patient fallen in the past 6 months No    Has the patient had a decrease in activity level because of a fear of falling?  No    Is the patient reluctant to leave their home because of a fear of falling?  No      Home Environment   Living Environment Private residence    Living Arrangements Spouse/significant other;Children    Available Help at Discharge  Family    Type of [place] to enter    Entrance Stairs-Number of Steps 3    Entrance Stairs-Rails None    Home Layout Two level;Laundry or work area in basement    Alternate Therapist, sports of Steps 15    Alternate Level Stairs-Rails Right    Administrator, arts      Prior Function   Level of Independence Independent    Vocation Full time employment    Biomedical scientist Optician    Leisure none      Cognition   Overall Cognitive Status Within Functional Limits for tasks assessed      Observation/Other Assessments   Observations well-healed R wrist and thumb incisions      Sensation   Light Touch Appears Intact      Coordination   Gross Motor Movements are Fluid and Coordinated Yes      ROM / Strength   AROM / PROM / Strength AROM;Strength      AROM   Overall AROM Comments R Palmar abduction 29 degrees, R Radial abduction 46 degrees    AROM Assessment Site Wrist     Right/Left Wrist Right    Right Wrist Extension 63 Degrees    Right Wrist Flexion 59 Degrees    Right Wrist Radial Deviation 11 Degrees    Right Wrist Ulnar Deviation 21 Degrees      Strength   Strength Assessment Site Hand;Wrist    Right/Left Wrist Right    Right Wrist Flexion 4+/5    Right Wrist Extension 4+/5    Right Wrist Radial Deviation 4+/5    Right Wrist Ulnar Deviation 4+/5    Right/Left hand Right    Right Hand Grip (lbs) 16   20, 15, 13   Right Hand 3 Point Pinch 5.67 lbs   6,5,6     Palpation   Palpation comment fully healed scars over the R palmar wrist and hand; soft tissue restriction over R carpal tunnel incision; no TTP                      Objective measurements completed on examination: See above findings.               PT Education - 07/28/20 1803    Education Details prognosis, POC, HEP    Person(s) Educated Patient    Methods Explanation;Demonstration;Tactile cues;Verbal cues;Handout    Comprehension Verbalized understanding;Returned demonstration            PT Short Term Goals - 07/28/20 1809      PT SHORT TERM GOAL #1   Title Patient to be independent with initial HEP.    Time 2    Period Weeks    Status New    Target Date 08/11/20             PT Long Term Goals - 07/28/20 1809      PT LONG TERM GOAL #1   Title Patient to be independent with advanced HEP.    Time 6    Period Weeks    Status New    Target Date 09/08/20      PT LONG TERM GOAL #2   Title Patient to demonstrate B thumb and wrist AROM WFL and without pain limiting.    Time 6    Period Weeks    Status New    Target Date 09/08/20      PT LONG TERM GOAL #  3   Title Patient to demonstrate B wrist strength >/=4+/5.    Time 6    Period Weeks    Status New    Target Date 09/08/20      PT LONG TERM GOAL #4   Title Patient to demonstrate B full handed grip stretch >/=30 lbs and 3 pt pinch grip >/=10 lbs.    Time 6    Period Weeks    Status  New    Target Date 09/08/20      PT LONG TERM GOAL #5   Title Patient to report 75% improvement in pain levels.    Time 6    Period Weeks    Status New    Target Date 09/08/20                  Plan - 07/28/20 1804    Clinical Impression Statement Patient is a 54 y/o F presenting to [facility] with c/o B thumb and wrist pain s/p L trapeziectomy, CMC arthroplasty with alloderm, and carpal tunnel release on 06/30/20 and Right Trapeziectomy, APL Suspensionplasty, and Mini-Open Carpal Tunnel Release on 01/23/20. Patient still in L hand splint. Notes R thumb and wrist pain when using the R hand to push or when stretching. Also reporting difficulty opening a bottle of water. No N/T. Patient today presenting with limited R thumb and wrist AROM, decreased full-handed grip and 3pt pinch grip strength, and soft tissue restriction over R carpal tunnel scar. Patient was educated on gentle tendon glides and stretching HEP- patient reported understanding. Did not receive post-op protocol for L hand, thus will be assessed next session once this information is received. Would benefit from skilled PT services 2x/week for 6 weeks to address aforementioned impairments.    Personal Factors and Comorbidities Age;Comorbidity 3+;Past/Current Experience;Profession;Time since onset of injury/illness/exacerbation    Comorbidities osteopenia, C7/T1 compression fractures and ligamentous injury 2021, concussion    Examination-Activity Limitations Carry;Dressing;Hygiene/Grooming    Examination-Participation Restrictions Cleaning;Shop;Laundry;Meal Prep;Occupation    Stability/Clinical Decision Making Stable/Uncomplicated    Clinical Decision Making Low    Rehab Potential Good    PT Frequency 2x / week    PT Duration 6 weeks    PT Treatment/Interventions ADLs/Self Care Home Management;Cryotherapy;Electrical Stimulation;Moist Heat;Therapeutic exercise;Therapeutic activities;Functional mobility  training;Ultrasound;Neuromuscular re-education;Patient/family education;Manual techniques;Vasopneumatic Device;Taping;Energy conservation;Dry needling;Passive range of motion;Scar mobilization    PT Next Visit Plan reassess HEP; assess L hand    Consulted and Agree with Plan of Care Patient           Patient will benefit from skilled therapeutic intervention in order to improve the following deficits and impairments:  Hypomobility, Decreased scar mobility, Decreased activity tolerance, Decreased strength, Increased fascial restricitons, Pain, Impaired UE functional use, Improper body mechanics, Decreased range of motion, Postural dysfunction, Impaired flexibility  Visit Diagnosis: Pain in joints of right hand  Pain in joints of left hand  Stiffness of right hand, not elsewhere classified  Stiffness of left hand, not elsewhere classified  Stiffness of left wrist, not elsewhere classified  Stiffness of right wrist, not elsewhere classified  Other symptoms and signs involving the musculoskeletal system     Problem List Patient Active Problem List   Diagnosis Date Noted  . Food allergy 08/04/2015  . Chronic rhinitis 08/04/2015  . Mild intermittent asthma 08/04/2015     [name], PT, DPT 07/28/20 6:13 PM   [facility] [address]  [place], [place], [address] Phone: [phone]  Fax:  [phone]  Name: Deloris Moger MRN: [id number] Date of Birth: [birth year]

## 2020-08-06 ENCOUNTER — Other Ambulatory Visit: Payer: Self-pay

## 2020-08-06 ENCOUNTER — Ambulatory Visit: Payer: PRIVATE HEALTH INSURANCE | Admitting: Physical Therapy

## 2020-08-06 ENCOUNTER — Encounter: Payer: Self-pay | Admitting: Physical Therapy

## 2020-08-06 DIAGNOSIS — M25541 Pain in joints of right hand: Secondary | ICD-10-CM | POA: Diagnosis not present

## 2020-08-06 DIAGNOSIS — M25542 Pain in joints of left hand: Secondary | ICD-10-CM

## 2020-08-06 DIAGNOSIS — M25642 Stiffness of left hand, not elsewhere classified: Secondary | ICD-10-CM

## 2020-08-06 DIAGNOSIS — M25631 Stiffness of right wrist, not elsewhere classified: Secondary | ICD-10-CM

## 2020-08-06 DIAGNOSIS — M25632 Stiffness of left wrist, not elsewhere classified: Secondary | ICD-10-CM

## 2020-08-06 DIAGNOSIS — R29898 Other symptoms and signs involving the musculoskeletal system: Secondary | ICD-10-CM

## 2020-08-06 DIAGNOSIS — M25641 Stiffness of right hand, not elsewhere classified: Secondary | ICD-10-CM

## 2020-08-06 NOTE — Therapy (Signed)
[facility] [address]  [place], [place], [address] Phone: [phone]   Fax:  [phone]  Physical Therapy Treatment  Patient Details  Name: Vickie Ross MRN: [id number] Date of Birth: 1965-11-04 Referring Provider (PT): Elvia Collum, MD   Encounter Date: 08/06/2020   PT End of Session - 08/06/20 1158    Visit Number 2    Number of Visits 13    Date for PT Re-Evaluation 09/08/20    PT Start Time 0927    PT Stop Time 1016    PT Time Calculation (min) 49 min    Activity Tolerance Patient tolerated treatment well    Behavior During Therapy [facility] for tasks assessed/performed           Past Medical History:  Diagnosis Date  . Allergic rhinitis   . Anxiety   . Asthma     Past Surgical History:  Procedure Laterality Date  . AUGMENTATION MAMMAPLASTY    . FOOT SURGERY Right 12/2013    There were no vitals filed for this visit.   Subjective Assessment - 08/06/20 0928    Subjective Reports that the R hand still hurts but notes improvement in pain from working on her scar.    Pertinent History osteopenia, C7/T1 compression fractures and ligamentous injury 2021, concussion    Diagnostic tests none    Patient Stated Goals improve pain and mobility    Currently in Pain? Yes    Pain Score 6     Pain Location Wrist    Pain Orientation Left    Pain Descriptors / Indicators Shooting    Pain Type Acute pain    Pain Radiating Towards thumb              [facility] PT Assessment - 08/06/20 0001      AROM   Overall AROM Comments L Palmar abduction 30 degrees, L Radial abduction 34 degrees; able to perform opposition to all fingers with slight difficulty to 5th digit    AROM Assessment Site Thumb;Wrist    Right/Left Wrist Left    Left Wrist Extension 31 Degrees    Left Wrist Flexion 33 Degrees    Left Wrist Radial Deviation 12 Degrees    Left Wrist Ulnar Deviation 21 Degrees    Right/Left Thumb Left                          [facility] Adult PT Treatment/Exercise - 08/06/20 0001      Exercises   Exercises Wrist;Hand      Hand Exercises   Purdue Pegboard B hands- edu on using distal 1st and 2nd joints as well as 3rd digit to help manuever peg    Other Hand Exercises L hand towel crumping 10x     Other Hand Exercises picking marbles out of yellow putty with B hand x5 min      Wrist Exercises   Wrist Flexion AROM;Left;10 reps;Seated    Wrist Flexion Limitations 3" hold each    Wrist Extension AROM;Left;10 reps;Seated    Wrist Extension Limitations 3" hold each      Manual Therapy   Manual Therapy Passive ROM;Soft tissue mobilization;Myofascial release;Joint mobilization    Manual therapy comments sitting    Joint Mobilization R wrist jt distraction and AP/PA mobs grade III to tolerance   hypomobility   Soft tissue mobilization STM to L palmar surface of hand and thumb; STM to R palm and  palmar scar    Myofascial Release R palmar scar massage    Passive ROM L wrist flexion/extension PROM, L thumb palmar/radial abduction                  PT Education - 08/06/20 1157    Education Details edu on L hand post-op precautions as well as HEP update    Person(s) Educated Patient    Methods Explanation;Demonstration;Tactile cues;Verbal cues;Handout    Comprehension Verbalized understanding;Returned demonstration            PT Short Term Goals - 08/06/20 1207      PT SHORT TERM GOAL #1   Title Patient to be independent with initial HEP.    Time 2    Period Weeks    Status On-going    Target Date 08/11/20             PT Long Term Goals - 08/06/20 1207      PT LONG TERM GOAL #1   Title Patient to be independent with advanced HEP.    Time 6    Period Weeks    Status On-going      PT LONG TERM GOAL #2   Title Patient to demonstrate B thumb and wrist AROM WFL and without pain limiting.    Time 6    Period Weeks    Status On-going      PT LONG TERM GOAL #3    Title Patient to demonstrate B wrist strength >/=4+/5.    Time 6    Period Weeks    Status On-going      PT LONG TERM GOAL #4   Title Patient to demonstrate B full handed grip stretch >/=30 lbs and 3 pt pinch grip >/=10 lbs.    Time 6    Period Weeks    Status On-going      PT LONG TERM GOAL #5   Title Patient to report 75% improvement in pain levels.    Time 6    Period Weeks    Status On-going                 Plan - 08/06/20 1158    Clinical Impression Statement Patient arrived to session with report of small improvement in R hand pain. This PT contacted [facility] on 07/31/20 for clarification of "OT [facility] protocol" according to MD note. Spoke with PT there, who advised that patient is to perform L thumb AROM at 4 weeks post-op while weaning off splint, followed by strengthening at 8 weeks post-op.  Educated patient on post-op precautions including weaning off the splint as tolerated and avoiding "hard opposition" at this time. Patient reported understanding. Patient demonstrating limited L thumb and wrist AROM upon assessment. L hand/thumb incisions are still healing, thus scar massage was not performed. Patient did tolerate L wrist and grip AROM without issue. Addressed STM and limited ROM with MT, with patient demonstrating soft tissue restriction over B thenar imminences and proximal hand. Updated HEP with exercises to address L hand impairments. Patient reported understanding and without complaints at end of session.    Comorbidities osteopenia, C7/T1 compression fractures and ligamentous injury 2021, concussion    PT Treatment/Interventions ADLs/Self Care Home Management;Cryotherapy;Electrical Stimulation;Moist Heat;Therapeutic exercise;Therapeutic activities;Functional mobility training;Ultrasound;Neuromuscular re-education;Patient/family education;Manual techniques;Vasopneumatic Device;Taping;Energy conservation;Dry needling;Passive range of motion;Scar  mobilization    PT Next Visit Plan progress L hand AROM, R hand ROM and strengthening    Consulted and Agree with Plan of Care  Patient           Patient will benefit from skilled therapeutic intervention in order to improve the following deficits and impairments:  Hypomobility, Decreased scar mobility, Decreased activity tolerance, Decreased strength, Increased fascial restricitons, Pain, Impaired UE functional use, Improper body mechanics, Decreased range of motion, Postural dysfunction, Impaired flexibility  Visit Diagnosis: Pain in joints of right hand  Pain in joints of left hand  Stiffness of right hand, not elsewhere classified  Stiffness of left hand, not elsewhere classified  Stiffness of left wrist, not elsewhere classified  Stiffness of right wrist, not elsewhere classified  Other symptoms and signs involving the musculoskeletal system     Problem List Patient Active Problem List   Diagnosis Date Noted  . Food allergy 08/04/2015  . Chronic rhinitis 08/04/2015  . Mild intermittent asthma 08/04/2015     [name], PT, DPT 08/06/20 12:09 PM   [facility] [address]  [place], [place], [address] Phone: [phone]   Fax:  [phone]  Name: Vickie Ross MRN: [id number] Date of Birth: 12/25/1965

## 2020-08-12 ENCOUNTER — Ambulatory Visit: Payer: PRIVATE HEALTH INSURANCE | Admitting: Physical Therapy

## 2020-08-12 ENCOUNTER — Encounter: Payer: Self-pay | Admitting: Physical Therapy

## 2020-08-12 ENCOUNTER — Other Ambulatory Visit: Payer: Self-pay

## 2020-08-12 DIAGNOSIS — M25541 Pain in joints of right hand: Secondary | ICD-10-CM | POA: Diagnosis not present

## 2020-08-12 DIAGNOSIS — M25542 Pain in joints of left hand: Secondary | ICD-10-CM

## 2020-08-12 DIAGNOSIS — R29898 Other symptoms and signs involving the musculoskeletal system: Secondary | ICD-10-CM

## 2020-08-12 DIAGNOSIS — M25631 Stiffness of right wrist, not elsewhere classified: Secondary | ICD-10-CM

## 2020-08-12 DIAGNOSIS — M25642 Stiffness of left hand, not elsewhere classified: Secondary | ICD-10-CM

## 2020-08-12 DIAGNOSIS — M25641 Stiffness of right hand, not elsewhere classified: Secondary | ICD-10-CM

## 2020-08-12 DIAGNOSIS — M25632 Stiffness of left wrist, not elsewhere classified: Secondary | ICD-10-CM

## 2020-08-12 NOTE — Therapy (Signed)
[facility] [address]  [place], [place], [address] Phone: [phone]   Fax:  [phone]  Physical Therapy Treatment  Patient Details  Name: Vickie Ross MRN: [id number] Date of Birth: 03/15/1966 Referring Provider (PT): Elvia Collum, MD   Encounter Date: 08/12/2020   PT End of Session - 08/12/20 9767    Visit Number 3    Number of Visits 13    Date for PT Re-Evaluation 09/08/20    PT Start Time 1706    PT Stop Time 1750    PT Time Calculation (min) 44 min    Activity Tolerance Patient tolerated treatment well    Behavior During Therapy [facility] for tasks assessed/performed           Past Medical History:  Diagnosis Date  . Allergic rhinitis   . Anxiety   . Asthma     Past Surgical History:  Procedure Laterality Date  . AUGMENTATION MAMMAPLASTY    . FOOT SURGERY Right 12/2013    There were no vitals filed for this visit.   Subjective Assessment - 08/12/20 1707    Subjective Was seen by her surgeon today- was told to keep her wrist splint for activities. Was told to work on strengthening, bending her thumb, and will be cleared to return to the gym when she can make a tight fist.    Pertinent History osteopenia, C7/T1 compression fractures and ligamentous injury 2021, concussion    Diagnostic tests none    Patient Stated Goals improve pain and mobility    Currently in Pain? No/denies                             [facility] Adult PT Treatment/Exercise - 08/12/20 0001      Hand Exercises   Other Hand Exercises R hand unscrewing bolt box x6 min   cueing for hand placement     Wrist Exercises   Wrist Flexion Strengthening;Right;10 reps;Seated    Bar Weights/Barbell (Wrist Flexion) 3 lbs    Wrist Flexion Limitations cues to decrease eccentric    Wrist Extension Strengthening;Right;10 reps;Seated    Bar Weights/Barbell (Wrist Extension) 3 lbs    Wrist Extension Limitations cues to  decrease eccentric    Wrist Radial Deviation Strengthening;10 reps;Standing    Wrist Radial Deviation Limitations distal grip on hammer and middle grip on long dowel rod    Wrist Ulnar Deviation Strengthening;Right;10 reps;Seated    Wrist Ulnar Deviation Limitations 2x10; distal grip on hammer and middle grip on long dowel rod      Manual Therapy   Manual Therapy Passive ROM;Soft tissue mobilization;Myofascial release;Joint mobilization    Manual therapy comments sitting    Joint Mobilization R wrist jt distraction and AP/PA mobs grade III to tolerance   hypomobility R>L   Soft tissue mobilization STM to L and R palmar surface of hand and thumb    Myofascial Release R palmar scar massage    Passive ROM L/R wrist flexion/extension PROM                  PT Education - 08/12/20 1752    Education Details update to HEP    Person(s) Educated Patient    Methods Explanation;Demonstration;Tactile cues;Verbal cues;Handout    Comprehension Verbalized understanding;Returned demonstration            PT Short Term Goals - 08/12/20 1808      PT SHORT TERM  GOAL #1   Title Patient to be independent with initial HEP.    Time 2    Period Weeks    Status On-going    Target Date 08/11/20             PT Long Term Goals - 08/06/20 1207      PT LONG TERM GOAL #1   Title Patient to be independent with advanced HEP.    Time 6    Period Weeks    Status On-going      PT LONG TERM GOAL #2   Title Patient to demonstrate B thumb and wrist AROM WFL and without pain limiting.    Time 6    Period Weeks    Status On-going      PT LONG TERM GOAL #3   Title Patient to demonstrate B wrist strength >/=4+/5.    Time 6    Period Weeks    Status On-going      PT LONG TERM GOAL #4   Title Patient to demonstrate B full handed grip stretch >/=30 lbs and 3 pt pinch grip >/=10 lbs.    Time 6    Period Weeks    Status On-going      PT LONG TERM GOAL #5   Title Patient to report 75%  improvement in pain levels.    Time 6    Period Weeks    Status On-going                 Plan - 08/12/20 1754    Clinical Impression Statement Patient reporting clearance for strengthening activities by her surgeon. Also reporting that she was told to return to the gym only when she is able to make a tight fist. Patient tolerated wrist joint mobs, wrist PROM, and soft tissue mobilization with hypomobility still demonstrated on R>L. Proceeded with gentle R wrist strengthening with patient tolerating this well. Updated HEP with exercises that were well tolerated today. Patient reported understanding and without complaints at end of session.    Comorbidities osteopenia, C7/T1 compression fractures and ligamentous injury 2021, concussion    PT Treatment/Interventions ADLs/Self Care Home Management;Cryotherapy;Electrical Stimulation;Moist Heat;Therapeutic exercise;Therapeutic activities;Functional mobility training;Ultrasound;Neuromuscular re-education;Patient/family education;Manual techniques;Vasopneumatic Device;Taping;Energy conservation;Dry needling;Passive range of motion;Scar mobilization    PT Next Visit Plan progress L hand AROM, R hand ROM and strengthening    Consulted and Agree with Plan of Care Patient           Patient will benefit from skilled therapeutic intervention in order to improve the following deficits and impairments:  Hypomobility, Decreased scar mobility, Decreased activity tolerance, Decreased strength, Increased fascial restricitons, Pain, Impaired UE functional use, Improper body mechanics, Decreased range of motion, Postural dysfunction, Impaired flexibility  Visit Diagnosis: Pain in joints of right hand  Pain in joints of left hand  Stiffness of right hand, not elsewhere classified  Stiffness of left hand, not elsewhere classified  Stiffness of left wrist, not elsewhere classified  Stiffness of right wrist, not elsewhere classified  Other symptoms and  signs involving the musculoskeletal system     Problem List Patient Active Problem List   Diagnosis Date Noted  . Food allergy 08/04/2015  . Chronic rhinitis 08/04/2015  . Mild intermittent asthma 08/04/2015     [name], PT, DPT 08/12/20 6:09 PM   [facility] [address]  [place], [place], [address] Phone: [phone]   Fax:  [phone]  Name: Vickie Ross MRN:  [id number] Date of Birth: [birth year]

## 2020-08-14 ENCOUNTER — Other Ambulatory Visit: Payer: Self-pay

## 2020-08-14 ENCOUNTER — Ambulatory Visit: Payer: PRIVATE HEALTH INSURANCE | Attending: Plastic Surgery | Admitting: Physical Therapy

## 2020-08-14 ENCOUNTER — Encounter: Payer: Self-pay | Admitting: Physical Therapy

## 2020-08-14 DIAGNOSIS — M25632 Stiffness of left wrist, not elsewhere classified: Secondary | ICD-10-CM | POA: Insufficient documentation

## 2020-08-14 DIAGNOSIS — R29898 Other symptoms and signs involving the musculoskeletal system: Secondary | ICD-10-CM | POA: Diagnosis present

## 2020-08-14 DIAGNOSIS — M25641 Stiffness of right hand, not elsewhere classified: Secondary | ICD-10-CM | POA: Insufficient documentation

## 2020-08-14 DIAGNOSIS — M25541 Pain in joints of right hand: Secondary | ICD-10-CM | POA: Insufficient documentation

## 2020-08-14 DIAGNOSIS — M25542 Pain in joints of left hand: Secondary | ICD-10-CM | POA: Insufficient documentation

## 2020-08-14 DIAGNOSIS — M25631 Stiffness of right wrist, not elsewhere classified: Secondary | ICD-10-CM | POA: Diagnosis present

## 2020-08-14 DIAGNOSIS — M25642 Stiffness of left hand, not elsewhere classified: Secondary | ICD-10-CM | POA: Diagnosis present

## 2020-08-14 NOTE — Therapy (Signed)
[facility] [address]  [place], [place], [address] Phone: [phone]   Fax:  [phone]  Physical Therapy Treatment  Patient Details  Name: Vickie Ross MRN: [id number] Date of Birth: 02-12-1966 Referring Provider (PT): Elvia Collum, MD   Encounter Date: 08/14/2020   PT End of Session - 08/14/20 1752    Visit Number 4    Number of Visits 13    Date for PT Re-Evaluation 09/08/20    PT Start Time 7793    PT Stop Time 1746    PT Time Calculation (min) 40 min    Activity Tolerance Patient tolerated treatment well;Patient limited by pain    Behavior During Therapy [facility] for tasks assessed/performed           Past Medical History:  Diagnosis Date   Allergic rhinitis    Anxiety    Asthma     Past Surgical History:  Procedure Laterality Date   AUGMENTATION MAMMAPLASTY     FOOT SURGERY Right 12/2013    There were no vitals filed for this visit.   Subjective Assessment - 08/14/20 1707    Subjective HEP is going fine. Does report remaining weakness in the R hand when holding a coffee cup but has not attempted with L.    Pertinent History osteopenia, C7/T1 compression fractures and ligamentous injury 2021, concussion    Diagnostic tests none    Patient Stated Goals improve pain and mobility    Currently in Pain? Yes    Pain Score 5     Pain Location --   thumb   Pain Orientation Left    Pain Descriptors / Indicators Aching    Pain Type Acute pain                             [facility] Adult PT Treatment/Exercise - 08/14/20 0001      Hand Exercises   In hand manipulation training  using B hands to thread large and small beads on string x7 min; tweezer dexterity test without use of tweezers x5 min   most difficulty with in-hand manipulation on R   Other Hand Exercises composite finger flexion 10x each hand, lumbrical grip 10x each     Other Hand Exercises R/L thumb circumduction in  CC/CCW directions 20x each   cues to increase ROM     Manual Therapy   Manual therapy comments sitting    Joint Mobilization L digits 2 & 3 MCP and PIP flexion/extension mobs, L carpal AP/PA mobs with distraction grade III/IV to tolerance    Soft tissue mobilization --    Passive ROM L digits 2 & 3 flexion/extension, wrisft flxion, extension, radial/ulnar deviation to tolerance                    PT Short Term Goals - 08/14/20 1755      PT SHORT TERM GOAL #1   Title Patient to be independent with initial HEP.    Time 2    Period Weeks    Status Achieved    Target Date 08/11/20             PT Long Term Goals - 08/06/20 1207      PT LONG TERM GOAL #1   Title Patient to be independent with advanced HEP.    Time 6    Period Weeks    Status On-going  PT LONG TERM GOAL #2   Title Patient to demonstrate B thumb and wrist AROM WFL and without pain limiting.    Time 6    Period Weeks    Status On-going      PT LONG TERM GOAL #3   Title Patient to demonstrate B wrist strength >/=4+/5.    Time 6    Period Weeks    Status On-going      PT LONG TERM GOAL #4   Title Patient to demonstrate B full handed grip stretch >/=30 lbs and 3 pt pinch grip >/=10 lbs.    Time 6    Period Weeks    Status On-going      PT LONG TERM GOAL #5   Title Patient to report 75% improvement in pain levels.    Time 6    Period Weeks    Status On-going                 Plan - 08/14/20 1752    Clinical Impression Statement HEP is going fine. Does report remaining weakness in the R hand when holding a coffee cup but has not attempted with L. Worked on L wrist and finger joint mobilizations as patient still demonstrating difficulty making closed fist on L hand d/t hypomobility in digits 2 and 3. Did demonstrate slight improvement after MT. Worked on L hand AROM following MT with patient demonstrating more thumb ROM limitation on R vs. L. Completed tasks to simulate threading beads  onto needle and using small pegboard with more difficulty with in-hand manipulation on R vs. L d/t difficulty making pad-to-pad contact in 3 pt pinch. Plan to address this in future sessions. Patient without complaints at end of session.    Comorbidities osteopenia, C7/T1 compression fractures and ligamentous injury 2021, concussion    PT Treatment/Interventions ADLs/Self Care Home Management;Cryotherapy;Electrical Stimulation;Moist Heat;Therapeutic exercise;Therapeutic activities;Functional mobility training;Ultrasound;Neuromuscular re-education;Patient/family education;Manual techniques;Vasopneumatic Device;Taping;Energy conservation;Dry needling;Passive range of motion;Scar mobilization    PT Next Visit Plan progress L hand AROM, R hand ROM and strengthening    Consulted and Agree with Plan of Care Patient           Patient will benefit from skilled therapeutic intervention in order to improve the following deficits and impairments:  Hypomobility, Decreased scar mobility, Decreased activity tolerance, Decreased strength, Increased fascial restricitons, Pain, Impaired UE functional use, Improper body mechanics, Decreased range of motion, Postural dysfunction, Impaired flexibility  Visit Diagnosis: Pain in joints of right hand  Pain in joints of left hand  Stiffness of right hand, not elsewhere classified  Stiffness of left hand, not elsewhere classified  Stiffness of left wrist, not elsewhere classified  Stiffness of right wrist, not elsewhere classified  Other symptoms and signs involving the musculoskeletal system     Problem List Patient Active Problem List   Diagnosis Date Noted   Food allergy 08/04/2015   Chronic rhinitis 08/04/2015   Mild intermittent asthma 08/04/2015    [name], PT, DPT 08/14/20 5:57 PM   [facility] [address]  [place], [place], [address] Phone: [phone]   Fax:   [phone]  Name: Vickie Ross MRN: [id number] Date of Birth: 06-Dec-1965

## 2020-08-18 ENCOUNTER — Ambulatory Visit: Payer: PRIVATE HEALTH INSURANCE | Admitting: Physical Therapy

## 2020-08-18 ENCOUNTER — Other Ambulatory Visit: Payer: Self-pay

## 2020-08-18 ENCOUNTER — Encounter: Payer: Self-pay | Admitting: Physical Therapy

## 2020-08-18 DIAGNOSIS — M25642 Stiffness of left hand, not elsewhere classified: Secondary | ICD-10-CM

## 2020-08-18 DIAGNOSIS — M25632 Stiffness of left wrist, not elsewhere classified: Secondary | ICD-10-CM

## 2020-08-18 DIAGNOSIS — M25641 Stiffness of right hand, not elsewhere classified: Secondary | ICD-10-CM

## 2020-08-18 DIAGNOSIS — M25542 Pain in joints of left hand: Secondary | ICD-10-CM

## 2020-08-18 DIAGNOSIS — R29898 Other symptoms and signs involving the musculoskeletal system: Secondary | ICD-10-CM

## 2020-08-18 DIAGNOSIS — M25541 Pain in joints of right hand: Secondary | ICD-10-CM

## 2020-08-18 DIAGNOSIS — M25631 Stiffness of right wrist, not elsewhere classified: Secondary | ICD-10-CM

## 2020-08-18 NOTE — Therapy (Signed)
[facility] [address]  [place], [place], [address] Phone: [phone]   Fax:  [phone]  Physical Therapy Treatment  Patient Details  Name: Vickie Ross MRN: [id number] Date of Birth: Aug 29, 1966 Referring Provider (PT): Elvia Collum, MD   Encounter Date: 08/18/2020   PT End of Session - 08/18/20 3614    Visit Number 5    Number of Visits 13    Date for PT Re-Evaluation 09/08/20    PT Start Time 1702    PT Stop Time 1744    PT Time Calculation (min) 42 min    Activity Tolerance Patient tolerated treatment well    Behavior During Therapy [facility] for tasks assessed/performed           Past Medical History:  Diagnosis Date  . Allergic rhinitis   . Anxiety   . Asthma     Past Surgical History:  Procedure Laterality Date  . AUGMENTATION MAMMAPLASTY    . FOOT SURGERY Right 12/2013    There were no vitals filed for this visit.   Subjective Assessment - 08/18/20 1702    Subjective Went without her brace today at work and it was kind of difficult. Went to the gym Saturday and used handweights with B hands with 8lbs on R, 5lbs on L with some soreness afterwards.    Pertinent History osteopenia, C7/T1 compression fractures and ligamentous injury 2021, concussion    Diagnostic tests none    Patient Stated Goals improve pain and mobility    Currently in Pain? Yes    Pain Score 5     Pain Location --   thumb   Pain Orientation Left    Pain Descriptors / Indicators Aching    Pain Type Acute pain                             [facility] Adult PT Treatment/Exercise - 08/18/20 0001      Hand Exercises   Other Hand Exercises R pad-to-pad pinch grip + prolonged hold 3x 20"      Manual Therapy   Manual Therapy Joint mobilization;Soft tissue mobilization;Myofascial release    Manual therapy comments sitting    Joint Mobilization L digit 2 MCP and PIP flexion and distraction jt mobs grade III/IV     Soft tissue mobilization scar massage to L palmar incision, L thernar eminence, and over palmar surface of 2nd digit; R thenar eminence and palmar surface of digits 2 and 3     Passive ROM L digits 2 flexion PROM to tolerance; R hand radial and palmar adduction to tolerance                    PT Short Term Goals - 08/14/20 1755      PT SHORT TERM GOAL #1   Title Patient to be independent with initial HEP.    Time 2    Period Weeks    Status Achieved    Target Date 08/11/20             PT Long Term Goals - 08/06/20 1207      PT LONG TERM GOAL #1   Title Patient to be independent with advanced HEP.    Time 6    Period Weeks    Status On-going      PT LONG TERM GOAL #2   Title Patient to demonstrate B thumb and wrist AROM  WFL and without pain limiting.    Time 6    Period Weeks    Status On-going      PT LONG TERM GOAL #3   Title Patient to demonstrate B wrist strength >/=4+/5.    Time 6    Period Weeks    Status On-going      PT LONG TERM GOAL #4   Title Patient to demonstrate B full handed grip stretch >/=30 lbs and 3 pt pinch grip >/=10 lbs.    Time 6    Period Weeks    Status On-going      PT LONG TERM GOAL #5   Title Patient to report 75% improvement in pain levels.    Time 6    Period Weeks    Status On-going                 Plan - 08/18/20 1748    Clinical Impression Statement Patient reporting that she went without her L hand splint for work today with some difficulty. Also noting ability to increase weighted resistance with wrist strengthening with some resultant soreness which dissipated. Focused heavily on manual therapy to address soft tissue restriction over B thenar eminences, L palmar scar as this was fully-healed today, and palmar surface of the metacarpals of digits 2 and 3. Patient also tolerated PROM of digits 1 and 2, and able to make a complete fist after MT. Also worked on ability to perform R thumb palmar and radial  adduction in order to complete pad-to-pad pinch. Patient tolerated session well and without complaints at end of session.    Comorbidities osteopenia, C7/T1 compression fractures and ligamentous injury 2021, concussion    PT Treatment/Interventions ADLs/Self Care Home Management;Cryotherapy;Electrical Stimulation;Moist Heat;Therapeutic exercise;Therapeutic activities;Functional mobility training;Ultrasound;Neuromuscular re-education;Patient/family education;Manual techniques;Vasopneumatic Device;Taping;Energy conservation;Dry needling;Passive range of motion;Scar mobilization    PT Next Visit Plan progress L hand AROM, R hand ROM and strengthening    Consulted and Agree with Plan of Care Patient           Patient will benefit from skilled therapeutic intervention in order to improve the following deficits and impairments:  Hypomobility, Decreased scar mobility, Decreased activity tolerance, Decreased strength, Increased fascial restricitons, Pain, Impaired UE functional use, Improper body mechanics, Decreased range of motion, Postural dysfunction, Impaired flexibility  Visit Diagnosis: Pain in joints of right hand  Pain in joints of left hand  Stiffness of right hand, not elsewhere classified  Stiffness of left hand, not elsewhere classified  Stiffness of left wrist, not elsewhere classified  Stiffness of right wrist, not elsewhere classified  Other symptoms and signs involving the musculoskeletal system     Problem List Patient Active Problem List   Diagnosis Date Noted  . Food allergy 08/04/2015  . Chronic rhinitis 08/04/2015  . Mild intermittent asthma 08/04/2015     [name], PT, DPT 08/18/20 5:53 PM   [facility] [address]  [place], [place], [address] Phone: [phone]   Fax:  [phone]  Name: Vickie Ross MRN: [id number] Date of Birth: [birth year]

## 2020-08-20 ENCOUNTER — Other Ambulatory Visit: Payer: Self-pay

## 2020-08-20 ENCOUNTER — Encounter: Payer: Self-pay | Admitting: Physical Therapy

## 2020-08-20 ENCOUNTER — Ambulatory Visit: Payer: PRIVATE HEALTH INSURANCE | Admitting: Physical Therapy

## 2020-08-20 DIAGNOSIS — M25632 Stiffness of left wrist, not elsewhere classified: Secondary | ICD-10-CM

## 2020-08-20 DIAGNOSIS — M25642 Stiffness of left hand, not elsewhere classified: Secondary | ICD-10-CM

## 2020-08-20 DIAGNOSIS — M25641 Stiffness of right hand, not elsewhere classified: Secondary | ICD-10-CM

## 2020-08-20 DIAGNOSIS — R29898 Other symptoms and signs involving the musculoskeletal system: Secondary | ICD-10-CM

## 2020-08-20 DIAGNOSIS — M25541 Pain in joints of right hand: Secondary | ICD-10-CM | POA: Diagnosis not present

## 2020-08-20 DIAGNOSIS — M25631 Stiffness of right wrist, not elsewhere classified: Secondary | ICD-10-CM

## 2020-08-20 DIAGNOSIS — M25542 Pain in joints of left hand: Secondary | ICD-10-CM

## 2020-08-20 NOTE — Therapy (Signed)
[facility] [address]  [place], [place], [address] Phone: [phone]   Fax:  [phone]  Physical Therapy Treatment  Patient Details  Name: Vickie Ross MRN: [id number] Date of Birth: 04-15-1966 Referring Provider (PT): Elvia Collum, MD   Encounter Date: 08/20/2020   PT End of Session - 08/20/20 1747    Visit Number 6    Number of Visits 13    Date for PT Re-Evaluation 09/08/20    PT Start Time 1700    PT Stop Time 1745    PT Time Calculation (min) 45 min    Activity Tolerance Patient tolerated treatment well;Patient limited by pain    Behavior During Therapy [facility] for tasks assessed/performed           Past Medical History:  Diagnosis Date  . Allergic rhinitis   . Anxiety   . Asthma     Past Surgical History:  Procedure Laterality Date  . AUGMENTATION MAMMAPLASTY    . FOOT SURGERY Right 12/2013    There were no vitals filed for this visit.   Subjective Assessment - 08/20/20 1700    Subjective Not much is new.    Pertinent History osteopenia, C7/T1 compression fractures and ligamentous injury 2021, concussion    Diagnostic tests none    Patient Stated Goals improve pain and mobility    Currently in Pain? Yes    Pain Score 5     Pain Location --   thumb   Pain Orientation Left    Pain Descriptors / Indicators Aching;Burning    Pain Type Acute pain                             [facility] Adult PT Treatment/Exercise - 08/20/20 0001      Hand Exercises   Digiticizer R hand green 2x15 focusing on digits 3 and 4    In hand manipulation training  R hand placing clothing pins on poles x5 min   trouble with manipulation and black pins   Other Hand Exercises R picking up coins and placing into slot x3 min    Other Hand Exercises R pad-to-pad pinch, key pinch, and 3 pt pinch using yellow putty;    correction of form required     Manual Therapy   Manual Therapy Joint  mobilization;Passive ROM    Manual therapy comments sitting    Joint Mobilization R and L wrist distraction and AP/PA mobs grade III/IV to tolerance   most hypomobility on L radial aspect of wrist   Passive ROM R and L wrist flexion, extension, radial, ulnar deviation PROM to tolerance with prolonged holds                     PT Short Term Goals - 08/14/20 1755      PT SHORT TERM GOAL #1   Title Patient to be independent with initial HEP.    Time 2    Period Weeks    Status Achieved    Target Date 08/11/20             PT Long Term Goals - 08/06/20 1207      PT LONG TERM GOAL #1   Title Patient to be independent with advanced HEP.    Time 6    Period Weeks    Status On-going      PT LONG TERM GOAL #2   Title  Patient to demonstrate B thumb and wrist AROM WFL and without pain limiting.    Time 6    Period Weeks    Status On-going      PT LONG TERM GOAL #3   Title Patient to demonstrate B wrist strength >/=4+/5.    Time 6    Period Weeks    Status On-going      PT LONG TERM GOAL #4   Title Patient to demonstrate B full handed grip stretch >/=30 lbs and 3 pt pinch grip >/=10 lbs.    Time 6    Period Weeks    Status On-going      PT LONG TERM GOAL #5   Title Patient to report 75% improvement in pain levels.    Time 6    Period Weeks    Status On-going                 Plan - 08/20/20 1747    Clinical Impression Statement Patient without new complaints today. Worked on resisted R handed fine pinching with patient requiring assistance to correct hand placement. Patient with tendency to use a tip-to-tip pinch with DIP flexion rather than pad-to-pad pinch. Demonstrated some challenge with resisted clothes pins and with remaining difficulty with in-hand manipulation. Ended session with B wrist stretching and joint mobilizations, with most hypomobility on L radial aspect of wrist. Patient tolerated session well and without complaints at end of session.     Comorbidities osteopenia, C7/T1 compression fractures and ligamentous injury 2021, concussion    PT Treatment/Interventions ADLs/Self Care Home Management;Cryotherapy;Electrical Stimulation;Moist Heat;Therapeutic exercise;Therapeutic activities;Functional mobility training;Ultrasound;Neuromuscular re-education;Patient/family education;Manual techniques;Vasopneumatic Device;Taping;Energy conservation;Dry needling;Passive range of motion;Scar mobilization    PT Next Visit Plan progress L hand AROM, R hand ROM and strengthening    Consulted and Agree with Plan of Care Patient           Patient will benefit from skilled therapeutic intervention in order to improve the following deficits and impairments:  Hypomobility, Decreased scar mobility, Decreased activity tolerance, Decreased strength, Increased fascial restricitons, Pain, Impaired UE functional use, Improper body mechanics, Decreased range of motion, Postural dysfunction, Impaired flexibility  Visit Diagnosis: Pain in joints of right hand  Pain in joints of left hand  Stiffness of right hand, not elsewhere classified  Stiffness of left hand, not elsewhere classified  Stiffness of left wrist, not elsewhere classified  Stiffness of right wrist, not elsewhere classified  Other symptoms and signs involving the musculoskeletal system     Problem List Patient Active Problem List   Diagnosis Date Noted  . Food allergy 08/04/2015  . Chronic rhinitis 08/04/2015  . Mild intermittent asthma 08/04/2015     [name], PT, DPT 08/20/20 5:52 PM   [facility] [address]  [place], [place], [address] Phone: [phone]   Fax:  [phone]  Name: Vickie Ross MRN: [id number] Date of Birth: Mar 16, 1966

## 2020-08-26 ENCOUNTER — Encounter: Payer: Self-pay | Admitting: Physical Therapy

## 2020-08-26 ENCOUNTER — Other Ambulatory Visit: Payer: Self-pay

## 2020-08-26 ENCOUNTER — Ambulatory Visit: Payer: PRIVATE HEALTH INSURANCE | Admitting: Physical Therapy

## 2020-08-26 DIAGNOSIS — M25631 Stiffness of right wrist, not elsewhere classified: Secondary | ICD-10-CM

## 2020-08-26 DIAGNOSIS — R29898 Other symptoms and signs involving the musculoskeletal system: Secondary | ICD-10-CM

## 2020-08-26 DIAGNOSIS — M25641 Stiffness of right hand, not elsewhere classified: Secondary | ICD-10-CM

## 2020-08-26 DIAGNOSIS — M25632 Stiffness of left wrist, not elsewhere classified: Secondary | ICD-10-CM

## 2020-08-26 DIAGNOSIS — M25541 Pain in joints of right hand: Secondary | ICD-10-CM | POA: Diagnosis not present

## 2020-08-26 DIAGNOSIS — M25642 Stiffness of left hand, not elsewhere classified: Secondary | ICD-10-CM

## 2020-08-26 DIAGNOSIS — M25542 Pain in joints of left hand: Secondary | ICD-10-CM

## 2020-08-26 NOTE — Therapy (Signed)
[facility] [address]  [place], [place], [address] Phone: [phone]   Fax:  [phone]  Physical Therapy Treatment  Patient Details  Name: Vickie Ross MRN: [id number] Date of Birth: 06-05-1966 Referring Provider (PT): Elvia Collum, MD   Encounter Date: 08/26/2020   PT End of Session - 08/26/20 1746    Visit Number 7    Number of Visits 13    Date for PT Re-Evaluation 09/08/20    PT Start Time 1656    PT Stop Time 1744    PT Time Calculation (min) 48 min    Activity Tolerance Patient tolerated treatment well;Patient limited by pain    Behavior During Therapy [facility] for tasks assessed/performed           Past Medical History:  Diagnosis Date  . Allergic rhinitis   . Anxiety   . Asthma     Past Surgical History:  Procedure Laterality Date  . AUGMENTATION MAMMAPLASTY    . FOOT SURGERY Right 12/2013    There were no vitals filed for this visit.   Subjective Assessment - 08/26/20 1657    Subjective Not much is new. Exercises are going okay- continues to stretch at work and at home.    Pertinent History osteopenia, C7/T1 compression fractures and ligamentous injury 2021, concussion    Diagnostic tests none    Patient Stated Goals improve pain and mobility    Currently in Pain? Yes    Pain Score 4     Pain Location --   thumb   Pain Orientation Left    Pain Descriptors / Indicators Aching    Pain Type Acute pain                             [facility] Adult PT Treatment/Exercise - 08/26/20 0001      Hand Exercises   Purdue Pegboard R and L hand pegboard with retrieval of pegs while keeping 10 pegs in hand x4 min   much improved on L hand   In hand manipulation training  R and L finger-to-finger translation using rings    Other Hand Exercises L hand yellow putty- full handed grip, pad to pad pinch, 3 pt pinch, ky pinch      Manual Therapy   Manual therapy comments sitting     Joint Mobilization L and R wrist distraction and AP/PA mobs grade III/IV to tolerance    Passive ROM L and R wrist flexion, extension, radial, ulnar deviation PROM to tolerance with prolonged holds                  PT Education - 08/26/20 1745    Education Details update to HEP    Person(s) Educated Patient    Methods Explanation;Demonstration;Tactile cues;Verbal cues;Handout    Comprehension Verbalized understanding;Returned demonstration            PT Short Term Goals - 08/14/20 1755      PT SHORT TERM GOAL #1   Title Patient to be independent with initial HEP.    Time 2    Period Weeks    Status Achieved    Target Date 08/11/20             PT Long Term Goals - 08/06/20 1207      PT LONG TERM GOAL #1   Title Patient to be independent with advanced HEP.    Time 6  Period Weeks    Status On-going      PT LONG TERM GOAL #2   Title Patient to demonstrate B thumb and wrist AROM WFL and without pain limiting.    Time 6    Period Weeks    Status On-going      PT LONG TERM GOAL #3   Title Patient to demonstrate B wrist strength >/=4+/5.    Time 6    Period Weeks    Status On-going      PT LONG TERM GOAL #4   Title Patient to demonstrate B full handed grip stretch >/=30 lbs and 3 pt pinch grip >/=10 lbs.    Time 6    Period Weeks    Status On-going      PT LONG TERM GOAL #5   Title Patient to report 75% improvement in pain levels.    Time 6    Period Weeks    Status On-going                 Plan - 08/26/20 1747    Clinical Impression Statement Patient reports continued compliance with HEP at home at work. Worked on B wrist joint mobilizations with patient demonstrating good improvement in L joint mobility today. Still limited in L wrist flexion d/t pain, however with seemingly improved R wrist PROM today. Worked on progressing in-hand manipulation and translation exercises with patient demonstrating great improvement in the dexterity of the R  hand. Initiated putty strengthening on the L hand today with visible difficulty d/t weakness. Updated HEP with putty exercises- patient reported understanding and without complaints at end of session. Patient progressing well towards goals.    Comorbidities osteopenia, C7/T1 compression fractures and ligamentous injury 2021, concussion    PT Treatment/Interventions ADLs/Self Care Home Management;Cryotherapy;Electrical Stimulation;Moist Heat;Therapeutic exercise;Therapeutic activities;Functional mobility training;Ultrasound;Neuromuscular re-education;Patient/family education;Manual techniques;Vasopneumatic Device;Taping;Energy conservation;Dry needling;Passive range of motion;Scar mobilization    PT Next Visit Plan progress L hand AROM, R hand ROM and strengthening    Consulted and Agree with Plan of Care Patient           Patient will benefit from skilled therapeutic intervention in order to improve the following deficits and impairments:  Hypomobility,Decreased scar mobility,Decreased activity tolerance,Decreased strength,Increased fascial restricitons,Pain,Impaired UE functional use,Improper body mechanics,Decreased range of motion,Postural dysfunction,Impaired flexibility  Visit Diagnosis: Pain in joints of right hand  Pain in joints of left hand  Stiffness of right hand, not elsewhere classified  Stiffness of left hand, not elsewhere classified  Stiffness of left wrist, not elsewhere classified  Stiffness of right wrist, not elsewhere classified  Other symptoms and signs involving the musculoskeletal system     Problem List Patient Active Problem List   Diagnosis Date Noted  . Food allergy 08/04/2015  . Chronic rhinitis 08/04/2015  . Mild intermittent asthma 08/04/2015     [name], PT, DPT 08/26/20 5:51 PM   [facility] [address]  [place], [place], [address] Phone: [phone]   Fax:   [phone]  Name: Vickie Ross MRN: [id number] Date of Birth: [birth year]

## 2020-08-28 ENCOUNTER — Encounter: Payer: Self-pay | Admitting: Physical Therapy

## 2020-08-28 ENCOUNTER — Other Ambulatory Visit: Payer: Self-pay

## 2020-08-28 ENCOUNTER — Ambulatory Visit: Payer: PRIVATE HEALTH INSURANCE | Admitting: Physical Therapy

## 2020-08-28 DIAGNOSIS — M25632 Stiffness of left wrist, not elsewhere classified: Secondary | ICD-10-CM

## 2020-08-28 DIAGNOSIS — M25541 Pain in joints of right hand: Secondary | ICD-10-CM

## 2020-08-28 DIAGNOSIS — M25641 Stiffness of right hand, not elsewhere classified: Secondary | ICD-10-CM

## 2020-08-28 DIAGNOSIS — M25631 Stiffness of right wrist, not elsewhere classified: Secondary | ICD-10-CM

## 2020-08-28 DIAGNOSIS — M25642 Stiffness of left hand, not elsewhere classified: Secondary | ICD-10-CM

## 2020-08-28 DIAGNOSIS — R29898 Other symptoms and signs involving the musculoskeletal system: Secondary | ICD-10-CM

## 2020-08-28 DIAGNOSIS — M25542 Pain in joints of left hand: Secondary | ICD-10-CM

## 2020-08-28 NOTE — Therapy (Signed)
[facility] [address]  [place], [place], [address] Phone: [phone]   Fax:  [phone]  Physical Therapy Treatment  Patient Details  Name: Vickie Ross MRN: [id number] Date of Birth: September 22, 1965 Referring Provider (PT): Elvia Collum, MD   Encounter Date: 08/28/2020   PT End of Session - 08/28/20 1748    Visit Number 8    Number of Visits 13    Date for PT Re-Evaluation 09/08/20    PT Start Time 1703    PT Stop Time 1745    PT Time Calculation (min) 42 min    Activity Tolerance Patient tolerated treatment well    Behavior During Therapy [facility] for tasks assessed/performed           Past Medical History:  Diagnosis Date   Allergic rhinitis    Anxiety    Asthma     Past Surgical History:  Procedure Laterality Date   AUGMENTATION MAMMAPLASTY     FOOT SURGERY Right 12/2013    There were no vitals filed for this visit.   Subjective Assessment - 08/28/20 1703    Subjective Went to the gym which increased pain to 6/10- feels like she over-did it. Notices that she was able to do a chest press with light weight with remaining tightness, but no pain and attributes this to the manual stretching done at sessions.    Pertinent History osteopenia, C7/T1 compression fractures and ligamentous injury 2021, concussion    Diagnostic tests none    Patient Stated Goals improve pain and mobility    Currently in Pain? Yes    Pain Score 3     Pain Location --   thumb   Pain Orientation Left    Pain Descriptors / Indicators Aching    Pain Type Acute pain                             [facility] Adult PT Treatment/Exercise - 08/28/20 0001      Hand Exercises   Other Hand Exercises R and L digit 2 IP and DIP flexion stretch with rubber bands x2 min   discontinued d/t cutting off blood supply     Manual Therapy   Manual therapy comments sitting    Joint Mobilization L and R wrist distraction and  AP/PA mobs grade III/IV to tolerance    Soft tissue mobilization scar massage to L palmar scar- manual and IASM; IASTM and STM to B thenar eminences   slight petechiae over superior portion of L palmar scar   Passive ROM L and R wrist flexion, extension, radial, ulnar deviation PROM to tolerance with prolonged holds; R and L wrist flexion MWM + AP wrist joint mobs                    PT Short Term Goals - 08/14/20 1755      PT SHORT TERM GOAL #1   Title Patient to be independent with initial HEP.    Time 2    Period Weeks    Status Achieved    Target Date 08/11/20             PT Long Term Goals - 08/06/20 1207      PT LONG TERM GOAL #1   Title Patient to be independent with advanced HEP.    Time 6    Period Weeks    Status On-going  PT LONG TERM GOAL #2   Title Patient to demonstrate B thumb and wrist AROM WFL and without pain limiting.    Time 6    Period Weeks    Status On-going      PT LONG TERM GOAL #3   Title Patient to demonstrate B wrist strength >/=4+/5.    Time 6    Period Weeks    Status On-going      PT LONG TERM GOAL #4   Title Patient to demonstrate B full handed grip stretch >/=30 lbs and 3 pt pinch grip >/=10 lbs.    Time 6    Period Weeks    Status On-going      PT LONG TERM GOAL #5   Title Patient to report 75% improvement in pain levels.    Time 6    Period Weeks    Status On-going                 Plan - 08/28/20 1748    Clinical Impression Statement Patient arrived to session with report of being able to perform chest press machine at the gym without pain, as this was previously a painful activity for her and attributes this to MT at PT sessions. Does report that she feels like to over did it with resisted wrist flexion/extension earlier today, but this has resolved. As patient is compliant with wrist strengthening at home, worked predominantly on MT to address wrist PROM, joint mobilizations, scar mobilizations, and  STM/IASTM. Patient is demonstrating good improvement in B palmar abduction and wrist extension. Still demonstrating limitations in wrist flexion and radial deviation, L >R. Patient noted good improvement in hand and wrist ROM at end of session and without complaints upon leaving.    Comorbidities osteopenia, C7/T1 compression fractures and ligamentous injury 2021, concussion    PT Treatment/Interventions ADLs/Self Care Home Management;Cryotherapy;Electrical Stimulation;Moist Heat;Therapeutic exercise;Therapeutic activities;Functional mobility training;Ultrasound;Neuromuscular re-education;Patient/family education;Manual techniques;Vasopneumatic Device;Taping;Energy conservation;Dry needling;Passive range of motion;Scar mobilization    PT Next Visit Plan progress L hand AROM, R hand ROM and strengthening    Consulted and Agree with Plan of Care Patient           Patient will benefit from skilled therapeutic intervention in order to improve the following deficits and impairments:  Hypomobility,Decreased scar mobility,Decreased activity tolerance,Decreased strength,Increased fascial restricitons,Pain,Impaired UE functional use,Improper body mechanics,Decreased range of motion,Postural dysfunction,Impaired flexibility  Visit Diagnosis: Pain in joints of right hand  Pain in joints of left hand  Stiffness of right hand, not elsewhere classified  Stiffness of left hand, not elsewhere classified  Stiffness of left wrist, not elsewhere classified  Stiffness of right wrist, not elsewhere classified  Other symptoms and signs involving the musculoskeletal system     Problem List Patient Active Problem List   Diagnosis Date Noted   Food allergy 08/04/2015   Chronic rhinitis 08/04/2015   Mild intermittent asthma 08/04/2015    Vickie Ross, PT, DPT 08/28/20 5:55 PM    [facility] [address]  [place], [place],  [address] Phone: [phone]   Fax:  [phone]  Name: Vickie Ross MRN: [id number] Date of Birth: 1966-08-25

## 2020-09-02 ENCOUNTER — Encounter: Payer: Self-pay | Admitting: Physical Therapy

## 2020-09-02 ENCOUNTER — Ambulatory Visit: Payer: PRIVATE HEALTH INSURANCE | Admitting: Physical Therapy

## 2020-09-02 ENCOUNTER — Other Ambulatory Visit: Payer: Self-pay

## 2020-09-02 DIAGNOSIS — M25641 Stiffness of right hand, not elsewhere classified: Secondary | ICD-10-CM

## 2020-09-02 DIAGNOSIS — R29898 Other symptoms and signs involving the musculoskeletal system: Secondary | ICD-10-CM

## 2020-09-02 DIAGNOSIS — M25542 Pain in joints of left hand: Secondary | ICD-10-CM

## 2020-09-02 DIAGNOSIS — M25631 Stiffness of right wrist, not elsewhere classified: Secondary | ICD-10-CM

## 2020-09-02 DIAGNOSIS — M25642 Stiffness of left hand, not elsewhere classified: Secondary | ICD-10-CM

## 2020-09-02 DIAGNOSIS — M25541 Pain in joints of right hand: Secondary | ICD-10-CM

## 2020-09-02 DIAGNOSIS — M25632 Stiffness of left wrist, not elsewhere classified: Secondary | ICD-10-CM

## 2020-09-02 NOTE — Therapy (Signed)
[facility] [address]  [place], [place], [address] Phone: [phone]   Fax:  [phone]  Physical Therapy Treatment  Patient Details  Name: Vickie Ross MRN: [id number] Date of Birth: 05/11/1966 Referring Provider (PT): Elvia Collum, MD   Encounter Date: 09/02/2020   PT End of Session - 09/02/20 1144    Visit Number 9    Number of Visits 13    Date for PT Re-Evaluation 09/08/20    PT Start Time 1101    PT Stop Time 1143    PT Time Calculation (min) 42 min    Activity Tolerance Patient tolerated treatment well    Behavior During Therapy [facility] for tasks assessed/performed           Past Medical History:  Diagnosis Date  . Allergic rhinitis   . Anxiety   . Asthma     Past Surgical History:  Procedure Laterality Date  . AUGMENTATION MAMMAPLASTY    . FOOT SURGERY Right 12/2013    There were no vitals filed for this visit.   Subjective Assessment - 09/02/20 1101    Subjective Reports that her R hand is better able to handle increased weights at the gym.    Pertinent History osteopenia, C7/T1 compression fractures and ligamentous injury 2021, concussion    Diagnostic tests none    Patient Stated Goals improve pain and mobility    Currently in Pain? Yes    Pain Score 3     Pain Location Hand    Pain Orientation Left    Pain Descriptors / Indicators Aching    Pain Type Acute pain                             [facility] Adult PT Treatment/Exercise - 09/02/20 0001      Hand Exercises   Other Hand Exercises R and L thumb + finger extension with rubber band 15x each    Other Hand Exercises R and L thumb palmar and radial abduction with rubber band 15x each      Wrist Exercises   Wrist Flexion Strengthening;Right;Left;5 reps;Seated    Wrist Flexion Limitations 2x5   slow eccentric phase; 5# on L, 8# on R   Wrist Extension Strengthening;Right;Left;5 reps;Seated    Wrist Extension  Limitations 2x5   slow eccentric phase; 3# on L, 5-6# on R     Manual Therapy   Manual therapy comments sitting    Joint Mobilization L wrist distraction and AP/PA mobs grade III/IV to tolerance    Soft tissue mobilization scar massage to L palmar scar    Passive ROM L wrist flexion, extension, radial, ulnar deviation PROM to tolerance with prolonged holds;  L wrist flexion MWM + AP wrist joint mobs                    PT Short Term Goals - 08/14/20 1755      PT SHORT TERM GOAL #1   Title Patient to be independent with initial HEP.    Time 2    Period Weeks    Status Achieved    Target Date 08/11/20             PT Long Term Goals - 08/06/20 1207      PT LONG TERM GOAL #1   Title Patient to be independent with advanced HEP.    Time 6    Period  Weeks    Status On-going      PT LONG TERM GOAL #2   Title Patient to demonstrate B thumb and wrist AROM WFL and without pain limiting.    Time 6    Period Weeks    Status On-going      PT LONG TERM GOAL #3   Title Patient to demonstrate B wrist strength >/=4+/5.    Time 6    Period Weeks    Status On-going      PT LONG TERM GOAL #4   Title Patient to demonstrate B full handed grip stretch >/=30 lbs and 3 pt pinch grip >/=10 lbs.    Time 6    Period Weeks    Status On-going      PT LONG TERM GOAL #5   Title Patient to report 75% improvement in pain levels.    Time 6    Period Weeks    Status On-going                 Plan - 09/02/20 1144    Clinical Impression Statement Patient reporting continued improvement for tolerating machine strengthening at the gym d/t improvement in R hand pain and wrist strength. Worked on B wrist strengthening with focus on controlled eccentric phase with patient showing good muscle control and tolerance, using slightly decreased weight on L vs. R. Worked on B multidirectional thumb strength with more visible difficulty on L vs. R d/t weakness. Ended session with L wrist joint  mobilizations and passive stretching to tolerance. Patient reported pain at end-ranges and noted some mild soreness at end of session.    Comorbidities osteopenia, C7/T1 compression fractures and ligamentous injury 2021, concussion    PT Treatment/Interventions ADLs/Self Care Home Management;Cryotherapy;Electrical Stimulation;Moist Heat;Therapeutic exercise;Therapeutic activities;Functional mobility training;Ultrasound;Neuromuscular re-education;Patient/family education;Manual techniques;Vasopneumatic Device;Taping;Energy conservation;Dry needling;Passive range of motion;Scar mobilization    PT Next Visit Plan progress L hand AROM, R hand ROM and strengthening    Consulted and Agree with Plan of Care Patient           Patient will benefit from skilled therapeutic intervention in order to improve the following deficits and impairments:  Hypomobility,Decreased scar mobility,Decreased activity tolerance,Decreased strength,Increased fascial restricitons,Pain,Impaired UE functional use,Improper body mechanics,Decreased range of motion,Postural dysfunction,Impaired flexibility  Visit Diagnosis: Pain in joints of right hand  Pain in joints of left hand  Stiffness of right hand, not elsewhere classified  Stiffness of left hand, not elsewhere classified  Stiffness of left wrist, not elsewhere classified  Stiffness of right wrist, not elsewhere classified  Other symptoms and signs involving the musculoskeletal system     Problem List Patient Active Problem List   Diagnosis Date Noted  . Food allergy 08/04/2015  . Chronic rhinitis 08/04/2015  . Mild intermittent asthma 08/04/2015     [name], PT, DPT 09/02/20 11:48 AM   [facility] [address]  [place], [place], [address] Phone: [phone]   Fax:  [phone]  Name: Vickie Ross MRN: [id number] Date of Birth: January 02, 1966

## 2020-09-04 ENCOUNTER — Encounter: Payer: Self-pay | Admitting: Physical Therapy

## 2020-09-04 ENCOUNTER — Ambulatory Visit: Payer: PRIVATE HEALTH INSURANCE | Admitting: Physical Therapy

## 2020-09-04 ENCOUNTER — Other Ambulatory Visit: Payer: Self-pay

## 2020-09-04 DIAGNOSIS — M25541 Pain in joints of right hand: Secondary | ICD-10-CM | POA: Diagnosis not present

## 2020-09-04 DIAGNOSIS — M25631 Stiffness of right wrist, not elsewhere classified: Secondary | ICD-10-CM

## 2020-09-04 DIAGNOSIS — M25642 Stiffness of left hand, not elsewhere classified: Secondary | ICD-10-CM

## 2020-09-04 DIAGNOSIS — M25632 Stiffness of left wrist, not elsewhere classified: Secondary | ICD-10-CM

## 2020-09-04 DIAGNOSIS — M25542 Pain in joints of left hand: Secondary | ICD-10-CM

## 2020-09-04 DIAGNOSIS — R29898 Other symptoms and signs involving the musculoskeletal system: Secondary | ICD-10-CM

## 2020-09-04 DIAGNOSIS — M25641 Stiffness of right hand, not elsewhere classified: Secondary | ICD-10-CM

## 2020-09-04 NOTE — Therapy (Signed)
[facility] [address]  [place], [place], [address] Phone: [phone]   Fax:  [phone]  Physical Therapy Progress Note  Patient Details  Name: Vickie Ross MRN: [id number] Date of Birth: 03/20/1966 Referring Provider (PT): Elvia Collum, MD  Progress Note Reporting Period 07/28/20 to 09/04/20  See note below for Objective Data and Assessment of Progress/Goals.     Encounter Date: 09/04/2020   PT End of Session - 09/04/20 0935    Visit Number 10    Number of Visits 13    Date for PT Re-Evaluation 09/08/20    PT Start Time 0846    PT Stop Time 0930    PT Time Calculation (min) 44 min    Activity Tolerance Patient tolerated treatment well;Patient limited by pain    Behavior During Therapy [facility] for tasks assessed/performed           Past Medical History:  Diagnosis Date  . Allergic rhinitis   . Anxiety   . Asthma     Past Surgical History:  Procedure Laterality Date  . AUGMENTATION MAMMAPLASTY    . FOOT SURGERY Right 12/2013    There were no vitals filed for this visit.   Subjective Assessment - 09/04/20 0849    Subjective Went to the gym yesterday and was able to increase her weight with wrist strengthening. Had a little bit of pain and soreness afterwards.  Reports 90% improvement in the R hand and 60% improvement in the L hand since starting therapy.    Pertinent History osteopenia, C7/T1 compression fractures and ligamentous injury 2021, concussion    Diagnostic tests none    Patient Stated Goals improve pain and mobility    Currently in Pain? Yes    Pain Score 2     Pain Location Hand    Pain Orientation Left    Pain Descriptors / Indicators Aching;Throbbing    Pain Type Acute pain              [facility] PT Assessment - 09/04/20 0001      Assessment   Medical Diagnosis [facility]    Referring Provider (PT) Elvia Collum, MD    Onset Date/Surgical Date 06/30/20       AROM   Overall AROM Comments R Palmar abduction 45 degrees, R Radial abduction 46 degrees; able to perform opposition to all fingers without difficulty, full composite finger flexion, and able to make a tight closed fist; L Palmar abduction 45 degrees, L Radial abduction 42 degrees; able to perform opposition to all fingers without difficulty, full composite finger flexion, and able to make a tight closed fist    Right Wrist Extension 72 Degrees    Right Wrist Flexion 71 Degrees    Right Wrist Radial Deviation 20 Degrees    Right Wrist Ulnar Deviation 32 Degrees    Left Wrist Extension 55 Degrees    Left Wrist Flexion 55 Degrees    Left Wrist Radial Deviation 12 Degrees    Left Wrist Ulnar Deviation 21 Degrees      Strength   Right/Left Wrist Right;Left    Right Wrist Flexion 4+/5    Right Wrist Extension 5/5    Right Wrist Radial Deviation 5/5    Right Wrist Ulnar Deviation 5/5    Left Wrist Flexion 4+/5    Left Wrist Extension 4+/5    Left Wrist Radial Deviation 4+/5    Left Wrist Ulnar Deviation 4+/5  Right/Left hand Right;Left    Right Hand Grip (lbs) 23.33   20, 25, 25   Right Hand 3 Point Pinch 7.67 lbs   7, 8, 8   Left Hand Grip (lbs) 6.67   10, 5, 5   Left Hand 3 Point Pinch 5.33 lbs   5, 5, 6                        [facility] Adult PT Treatment/Exercise - 09/04/20 0001      Manual Therapy   Manual therapy comments sitting    Passive ROM L/R wrist flexion, extension PROM to tolerance with prolonged holds; R/L thumb opposition and abduction PROM to tolerance                  PT Education - 09/04/20 0934    Education Details discussion on objective/subjective progress and remaining impairments    Person(s) Educated Patient    Methods Explanation;Demonstration;Tactile cues;Verbal cues    Comprehension Verbalized understanding            PT Short Term Goals - 09/04/20 0853      PT SHORT TERM GOAL #1   Title Patient to be independent with  initial HEP.    Time 2    Period Weeks    Status Achieved    Target Date 08/11/20             PT Long Term Goals - 09/04/20 0853      PT LONG TERM GOAL #1   Title Patient to be independent with advanced HEP.    Time 6    Period Weeks    Status Partially Met   met for currnet     PT LONG TERM GOAL #2   Title Patient to demonstrate B thumb and wrist AROM WFL and without pain limiting.    Time 6    Period Weeks    Status Partially Met   B thumb AROM WFL, L wrist radial and ulnar deviation limited     PT LONG TERM GOAL #3   Title Patient to demonstrate B wrist strength >/=4+/5.    Time 6    Period Weeks    Status Achieved      PT LONG TERM GOAL #4   Title Patient to demonstrate B full handed grip strength >/=30 lbs and 3 pt pinch grip >/=10 lbs.    Time 6    Period Weeks    Status Partially Met   improved B, still limited     PT LONG TERM GOAL #5   Title Patient to report 75% improvement in pain levels.    Time 6    Period Weeks    Status Partially Met   90% improvement in the R hand and 60% improvement in the L hand                Plan - 09/04/20 0935    Clinical Impression Statement Patient reports 90% improvement in the R hand and 60% improvement in the L hand since starting therapy. Has been consistently going to the gym to work on weight training and notes improvements, however still noting L hand fatigue with increased reps of shoulder exercises. Patient has met B wrist strength goal. B full handed and 3 pt pinch grip strength has improved, with most weakness still evident on L hand. B thumb/hand AROM is now normal and functional. Wrist AROM has improved in all planes on R, in  flexion and extension on L. Discussed current progress and remaining deficits with patient, and encouraged her to continue strength training and ulnar/radial deviation stretching. Patient reported understanding and without complaints at end of session. Patient is progressing well towards  goals.    Comorbidities osteopenia, C7/T1 compression fractures and ligamentous injury 2021, concussion    PT Treatment/Interventions ADLs/Self Care Home Management;Cryotherapy;Electrical Stimulation;Moist Heat;Therapeutic exercise;Therapeutic activities;Functional mobility training;Ultrasound;Neuromuscular re-education;Patient/family education;Manual techniques;Vasopneumatic Device;Taping;Energy conservation;Dry needling;Passive range of motion;Scar mobilization    PT Next Visit Plan progress L hand AROM, R hand ROM and strengthening    Consulted and Agree with Plan of Care Patient           Patient will benefit from skilled therapeutic intervention in order to improve the following deficits and impairments:  Hypomobility,Decreased scar mobility,Decreased activity tolerance,Decreased strength,Increased fascial restricitons,Pain,Impaired UE functional use,Improper body mechanics,Decreased range of motion,Postural dysfunction,Impaired flexibility  Visit Diagnosis: Pain in joints of right hand  Pain in joints of left hand  Stiffness of right hand, not elsewhere classified  Stiffness of left hand, not elsewhere classified  Stiffness of left wrist, not elsewhere classified  Stiffness of right wrist, not elsewhere classified  Other symptoms and signs involving the musculoskeletal system     Problem List Patient Active Problem List   Diagnosis Date Noted  . Food allergy 08/04/2015  . Chronic rhinitis 08/04/2015  . Mild intermittent asthma 08/04/2015     [name], PT, DPT 09/04/20 9:43 AM   [facility] [address]  [place], [place], [address] Phone: [phone]   Fax:  [phone]  Name: Vernis Cabacungan MRN: [id number] Date of Birth: 01-11-1966

## 2020-09-17 ENCOUNTER — Other Ambulatory Visit: Payer: Self-pay | Admitting: Internal Medicine

## 2020-09-17 DIAGNOSIS — Z1231 Encounter for screening mammogram for malignant neoplasm of breast: Secondary | ICD-10-CM

## 2020-09-18 ENCOUNTER — Ambulatory Visit: Payer: PRIVATE HEALTH INSURANCE | Admitting: Physical Therapy

## 2020-09-22 ENCOUNTER — Ambulatory Visit: Payer: PRIVATE HEALTH INSURANCE | Attending: Plastic Surgery | Admitting: Physical Therapy

## 2020-09-22 ENCOUNTER — Encounter: Payer: Self-pay | Admitting: Physical Therapy

## 2020-09-22 ENCOUNTER — Other Ambulatory Visit: Payer: Self-pay

## 2020-09-22 DIAGNOSIS — M25631 Stiffness of right wrist, not elsewhere classified: Secondary | ICD-10-CM | POA: Insufficient documentation

## 2020-09-22 DIAGNOSIS — M25542 Pain in joints of left hand: Secondary | ICD-10-CM | POA: Insufficient documentation

## 2020-09-22 DIAGNOSIS — M25632 Stiffness of left wrist, not elsewhere classified: Secondary | ICD-10-CM | POA: Insufficient documentation

## 2020-09-22 DIAGNOSIS — M25642 Stiffness of left hand, not elsewhere classified: Secondary | ICD-10-CM | POA: Insufficient documentation

## 2020-09-22 DIAGNOSIS — M25541 Pain in joints of right hand: Secondary | ICD-10-CM | POA: Diagnosis present

## 2020-09-22 DIAGNOSIS — M25641 Stiffness of right hand, not elsewhere classified: Secondary | ICD-10-CM | POA: Diagnosis present

## 2020-09-22 DIAGNOSIS — R29898 Other symptoms and signs involving the musculoskeletal system: Secondary | ICD-10-CM | POA: Diagnosis present

## 2020-09-22 NOTE — Therapy (Signed)
[facility] [address]  [place], [place], [address] Phone: [phone]   Fax:  [phone]  Physical Therapy Treatment  Patient Details  Name: Vickie Ross MRN: [id number] Date of Birth: 1966/07/01 Referring Provider (PT): Elvia Collum, MD   Encounter Date: 09/22/2020   PT End of Session - 09/22/20 1757    Visit Number 11    Number of Visits 13    Date for PT Re-Evaluation 09/08/20    PT Start Time 7494    PT Stop Time 1753    PT Time Calculation (min) 55 min    Activity Tolerance Patient tolerated treatment well;Patient limited by pain    Behavior During Therapy [facility] for tasks assessed/performed           Past Medical History:  Diagnosis Date  . Allergic rhinitis   . Anxiety   . Asthma     Past Surgical History:  Procedure Laterality Date  . AUGMENTATION MAMMAPLASTY    . FOOT SURGERY Right 12/2013    There were no vitals filed for this visit.   Subjective Assessment - 09/22/20 1659    Subjective Reports that her L thumb locked up a coupe times when lifting something and notes that her thumb has been sore since. Discussing getting a cortisone injection with her MD, who she will be seeing tomorrow.    Pertinent History osteopenia, C7/T1 compression fractures and ligamentous injury 2021, concussion    Diagnostic tests none    Patient Stated Goals improve pain and mobility    Currently in Pain? Yes    Pain Score 5     Pain Location --   thumb   Pain Descriptors / Indicators Throbbing    Pain Type Acute pain                             [facility] Adult PT Treatment/Exercise - 09/22/20 0001      Exercises   Exercises Wrist;Hand;Shoulder      Shoulder Exercises: ROM/Strengthening   UBE (Upper Arm Bike) L3.0 44mn forward/3 min back      Manual Therapy   Manual therapy comments sitting    Joint Mobilization L & R wrist distraction and AP/PA mobs grade III/IV to tolerance     Passive ROM L/R wrist flexion, extension, ulnar and radial deviation PROM to tolerance with prolonged holds;           Treatment:    R& L hand farmer's carry with 2 2.5# disc- 30 sec each; more difficulty on L  R/L static farmer's carry with towel wrapped around 10# on R, 8# on L- 30" each  R/L shoulder flexion with hand pronated with towel wrapped around weight- 3# 10x   Cybex B triceps extension 15x 10#   Low row with strap handles 25# 10x- cues to avoid deep squat to avoid knee pain  Cybex narrow grip row 10x 55#, 5x 55# with slow eccentric phase  Cybex lat pulldown 35# 10x        PT Short Term Goals - 09/04/20 0853      PT SHORT TERM GOAL #1   Title Patient to be independent with initial HEP.    Time 2    Period Weeks    Status Achieved    Target Date 08/11/20             PT Long Term Goals - 09/04/20 04967  PT LONG TERM GOAL #1   Title Patient to be independent with advanced HEP.    Time 6    Period Weeks    Status Partially Met   met for currnet     PT LONG TERM GOAL #2   Title Patient to demonstrate B thumb and wrist AROM WFL and without pain limiting.    Time 6    Period Weeks    Status Partially Met   B thumb AROM WFL, L wrist radial and ulnar deviation limited     PT LONG TERM GOAL #3   Title Patient to demonstrate B wrist strength >/=4+/5.    Time 6    Period Weeks    Status Achieved      PT LONG TERM GOAL #4   Title Patient to demonstrate B full handed grip strength >/=30 lbs and 3 pt pinch grip >/=10 lbs.    Time 6    Period Weeks    Status Partially Met   improved B, still limited     PT LONG TERM GOAL #5   Title Patient to report 75% improvement in pain levels.    Time 6    Period Weeks    Status Partially Met   90% improvement in the R hand and 60% improvement in the L hand                Plan - 09/22/20 1758    Clinical Impression Statement Patient reporting that her L thumb "locked up" a couple times when lifting  things and notes that her thumb has been sore since. Discussing getting a cortisone injection with her MD, who she will be seeing tomorrow. Worked on manual B wrist joint mobilizations and wrist PROM with focus on ulnar and radial deviation. Proceeded to work on endurance with grip strengthening. Patient demonstrating L grip weakness with these activities, particularly when increasing challenge for full handed grip with use of towel around dumbbells. Continued with machine strengthening, which patient tolerated fairly well. Ended session without complaints at end of session. Patient progressing well.    Comorbidities osteopenia, C7/T1 compression fractures and ligamentous injury 2021, concussion    PT Treatment/Interventions ADLs/Self Care Home Management;Cryotherapy;Electrical Stimulation;Moist Heat;Therapeutic exercise;Therapeutic activities;Functional mobility training;Ultrasound;Neuromuscular re-education;Patient/family education;Manual techniques;Vasopneumatic Device;Taping;Energy conservation;Dry needling;Passive range of motion;Scar mobilization    PT Next Visit Plan progress L hand AROM, R hand ROM and strengthening    Consulted and Agree with Plan of Care Patient           Patient will benefit from skilled therapeutic intervention in order to improve the following deficits and impairments:  Hypomobility,Decreased scar mobility,Decreased activity tolerance,Decreased strength,Increased fascial restricitons,Pain,Impaired UE functional use,Improper body mechanics,Decreased range of motion,Postural dysfunction,Impaired flexibility  Visit Diagnosis: Pain in joints of right hand  Pain in joints of left hand  Stiffness of right hand, not elsewhere classified  Stiffness of left hand, not elsewhere classified  Stiffness of left wrist, not elsewhere classified  Stiffness of right wrist, not elsewhere classified  Other symptoms and signs involving the musculoskeletal system     Problem  List Patient Active Problem List   Diagnosis Date Noted  . Food allergy 08/04/2015  . Chronic rhinitis 08/04/2015  . Mild intermittent asthma 08/04/2015     [name], PT, DPT 09/22/20 6:00 PM   [facility] [address]  [place], [place], [address] Phone: [phone]   Fax:  [phone]  Name: Nohelani Benning MRN: [id number]  Date of Birth: January 09, 1966

## 2020-09-29 ENCOUNTER — Ambulatory Visit: Payer: PRIVATE HEALTH INSURANCE | Admitting: Physical Therapy

## 2020-10-02 ENCOUNTER — Ambulatory Visit: Payer: PRIVATE HEALTH INSURANCE | Admitting: Physical Therapy

## 2020-10-06 ENCOUNTER — Other Ambulatory Visit: Payer: Self-pay

## 2020-10-06 ENCOUNTER — Ambulatory Visit: Payer: PRIVATE HEALTH INSURANCE | Admitting: Physical Therapy

## 2020-10-06 ENCOUNTER — Encounter: Payer: Self-pay | Admitting: Physical Therapy

## 2020-10-06 DIAGNOSIS — R29898 Other symptoms and signs involving the musculoskeletal system: Secondary | ICD-10-CM

## 2020-10-06 DIAGNOSIS — M25541 Pain in joints of right hand: Secondary | ICD-10-CM | POA: Diagnosis not present

## 2020-10-06 DIAGNOSIS — M25632 Stiffness of left wrist, not elsewhere classified: Secondary | ICD-10-CM

## 2020-10-06 DIAGNOSIS — M25542 Pain in joints of left hand: Secondary | ICD-10-CM

## 2020-10-06 DIAGNOSIS — M25642 Stiffness of left hand, not elsewhere classified: Secondary | ICD-10-CM

## 2020-10-06 DIAGNOSIS — M25641 Stiffness of right hand, not elsewhere classified: Secondary | ICD-10-CM

## 2020-10-06 DIAGNOSIS — M25631 Stiffness of right wrist, not elsewhere classified: Secondary | ICD-10-CM

## 2020-10-06 NOTE — Therapy (Signed)
[facility] [address]  [place], [place], [address] Phone: [phone]   Fax:  [phone]  Physical Therapy Discharge Summary  Patient Details  Name: Vickie Ross MRN: [id number] Date of Birth: Oct 05, 1965 Referring Provider (PT): Elvia Collum, MD   Encounter Date: 10/06/2020   PT End of Session - 10/06/20 1753    Visit Number 12    Number of Visits 13    Date for PT Re-Evaluation 09/08/20    PT Start Time 1702    PT Stop Time 1749    PT Time Calculation (min) 47 min    Activity Tolerance Patient tolerated treatment well    Behavior During Therapy [facility] for tasks assessed/performed           Past Medical History:  Diagnosis Date  . Allergic rhinitis   . Anxiety   . Asthma     Past Surgical History:  Procedure Laterality Date  . AUGMENTATION MAMMAPLASTY    . FOOT SURGERY Right 12/2013    There were no vitals filed for this visit.   Subjective Assessment - 10/06/20 1706    Subjective Had an injection to the L thumb a week ago which helped a lot. Notes no more locking episodes. Did a lot of work trying to clear out the ice on her driveway which made her thumb sore. Feels that she is ready to wrap up with therapy at this time.    Pertinent History osteopenia, C7/T1 compression fractures and ligamentous injury 2021, concussion    Diagnostic tests none    Patient Stated Goals improve pain and mobility    Currently in Pain? Yes    Pain Score 4     Pain Location --   thumb   Pain Orientation Left    Pain Descriptors / Indicators Aching    Pain Type Acute pain              [facility] PT Assessment - 10/06/20 0001      Assessment   Medical Diagnosis [facility]    Referring Provider (PT) Elvia Collum, MD    Onset Date/Surgical Date 06/30/20      AROM   Right Wrist Extension 72 Degrees    Right Wrist Flexion 75 Degrees    Right Wrist Radial Deviation 22 Degrees    Right Wrist Ulnar  Deviation 32 Degrees    Left Wrist Extension 72 Degrees    Left Wrist Flexion 62 Degrees    Left Wrist Radial Deviation 15 Degrees    Left Wrist Ulnar Deviation 32 Degrees      Strength   Right Hand Grip (lbs) 26.67   26, 26, 28   Right Hand 3 Point Pinch 6.67 lbs   6, 7, 7   Left Hand Grip (lbs) 18.33   15, 20, 20   Left Hand 3 Point Pinch 6.67 lbs   6, 7, 7                        [facility] Adult PT Treatment/Exercise - 10/06/20 0001      Shoulder Exercises: ROM/Strengthening   UBE (Upper Arm Bike) L3.0 27mn forward/3 min back                  PT Education - 10/06/20 1752    Education Details discussion on objective progress and remaning impairments; update/consolidation of HEP, discussion on modifications/exercise ideas to continue grip strength training at gym  Person(s) Educated Patient    Methods Explanation;Demonstration;Handout;Tactile cues;Verbal cues    Comprehension Verbalized understanding            PT Short Term Goals - 10/06/20 1717      PT SHORT TERM GOAL #1   Title Patient to be independent with initial HEP.    Time 2    Period Weeks    Status Achieved    Target Date 08/11/20             PT Long Term Goals - 10/06/20 1717      PT LONG TERM GOAL #1   Title Patient to be independent with advanced HEP.    Time 6    Period Weeks    Status Achieved   met for currnet     PT LONG TERM GOAL #2   Title Patient to demonstrate B thumb and wrist AROM WFL and without pain limiting.    Time 6    Period Weeks    Status Achieved      PT LONG TERM GOAL #3   Title Patient to demonstrate B wrist strength >/=4+/5.    Time 6    Period Weeks    Status Achieved      PT LONG TERM GOAL #4   Title Patient to demonstrate B full handed grip strength >/=30 lbs and 3 pt pinch grip >/=10 lbs.    Time 6    Period Weeks    Status Partially Met   improved B, still limited     PT LONG TERM GOAL #5   Title Patient to report 75% improvement in  pain levels.    Time 6    Period Weeks    Status Partially Met   90% improvement in the R hand and 70% improvement in the L hand                Plan - 10/06/20 1753    Clinical Impression Statement Patient reported improvement in L thumb pain and locking after receiving a L thumb injection about a week ago. Reports that did a lot of work clearing her driveway of ice and noticed some hand soreness and fatigue, but this has now mostly resolved. Patient now reports 90% improvement R hand pain and 70% improvement in L hand pain. B grip strength has improved considerably, however with 3 point pinch grip still somewhat limited. B wrist AROM continues to improve and is now functional, with L wrist flexion and radial deviation still slightly limited from normal. Patient has demonstrated excellent progress towards therapy at this time and is ready for transition to home program.    Comorbidities osteopenia, C7/T1 compression fractures and ligamentous injury 2021, concussion    PT Treatment/Interventions ADLs/Self Care Home Management;Cryotherapy;Electrical Stimulation;Moist Heat;Therapeutic exercise;Therapeutic activities;Functional mobility training;Ultrasound;Neuromuscular re-education;Patient/family education;Manual techniques;Vasopneumatic Device;Taping;Energy conservation;Dry needling;Passive range of motion;Scar mobilization    PT Next Visit Plan DC at this time    Consulted and Agree with Plan of Care Patient           Patient will benefit from skilled therapeutic intervention in order to improve the following deficits and impairments:  Hypomobility,Decreased scar mobility,Decreased activity tolerance,Decreased strength,Increased fascial restricitons,Pain,Impaired UE functional use,Improper body mechanics,Decreased range of motion,Postural dysfunction,Impaired flexibility  Visit Diagnosis: Pain in joints of right hand  Pain in joints of left hand  Stiffness of right hand, not elsewhere  classified  Stiffness of left hand, not elsewhere classified  Stiffness of left wrist, not elsewhere classified  Stiffness  of right wrist, not elsewhere classified  Other symptoms and signs involving the musculoskeletal system     Problem List Patient Active Problem List   Diagnosis Date Noted  . Food allergy 08/04/2015  . Chronic rhinitis 08/04/2015  . Mild intermittent asthma 08/04/2015     PHYSICAL THERAPY DISCHARGE SUMMARY  Visits from Start of Care: 12  Current functional level related to goals / functional outcomes: See above clinical impression   Remaining deficits: Remaining pain, grip weakness   Education / Equipment: HEP  Plan: Patient agrees to discharge.  Patient goals were partially met. Patient is being discharged due to being pleased with the current functional level.  ?????     [name], PT, DPT 10/06/20 5:58 PM    [facility] [address]  [place], [place], [address] Phone: [phone]   Fax:  [phone]  Name: Vickie Ross MRN: [id number] Date of Birth: 26-May-1966

## 2020-10-09 ENCOUNTER — Encounter: Payer: PRIVATE HEALTH INSURANCE | Admitting: Physical Therapy

## 2020-12-28 ENCOUNTER — Emergency Department (HOSPITAL_BASED_OUTPATIENT_CLINIC_OR_DEPARTMENT_OTHER): Payer: PRIVATE HEALTH INSURANCE

## 2020-12-28 ENCOUNTER — Encounter (HOSPITAL_BASED_OUTPATIENT_CLINIC_OR_DEPARTMENT_OTHER): Payer: Self-pay | Admitting: Emergency Medicine

## 2020-12-28 ENCOUNTER — Emergency Department (HOSPITAL_BASED_OUTPATIENT_CLINIC_OR_DEPARTMENT_OTHER)
Admission: EM | Admit: 2020-12-28 | Discharge: 2020-12-28 | Disposition: A | Discharge status: Home / Self Care | Payer: PRIVATE HEALTH INSURANCE | Attending: Emergency Medicine | Admitting: Emergency Medicine

## 2020-12-28 ENCOUNTER — Other Ambulatory Visit: Payer: Self-pay

## 2020-12-28 DIAGNOSIS — R0781 Pleurodynia: Secondary | ICD-10-CM

## 2020-12-28 DIAGNOSIS — M79602 Pain in left arm: Secondary | ICD-10-CM | POA: Insufficient documentation

## 2020-12-28 DIAGNOSIS — M25532 Pain in left wrist: Secondary | ICD-10-CM | POA: Diagnosis not present

## 2020-12-28 DIAGNOSIS — J4520 Mild intermittent asthma, uncomplicated: Secondary | ICD-10-CM | POA: Insufficient documentation

## 2020-12-28 DIAGNOSIS — M25512 Pain in left shoulder: Secondary | ICD-10-CM | POA: Insufficient documentation

## 2020-12-28 DIAGNOSIS — R519 Headache, unspecified: Secondary | ICD-10-CM | POA: Diagnosis not present

## 2020-12-28 DIAGNOSIS — Z91010 Allergy to peanuts: Secondary | ICD-10-CM | POA: Insufficient documentation

## 2020-12-28 DIAGNOSIS — W19XXXA Unspecified fall, initial encounter: Secondary | ICD-10-CM | POA: Insufficient documentation

## 2020-12-28 DIAGNOSIS — N644 Mastodynia: Secondary | ICD-10-CM | POA: Diagnosis not present

## 2020-12-28 DIAGNOSIS — Y93K1 Activity, walking an animal: Secondary | ICD-10-CM | POA: Insufficient documentation

## 2020-12-28 LAB — CBC WITH DIFFERENTIAL/PLATELET
Abs Immature Granulocytes: 0.02 10*3/uL (ref 0.00–0.07)
Basophils Absolute: 0 10*3/uL (ref 0.0–0.1)
Basophils Relative: 0 %
Eosinophils Absolute: 0 10*3/uL (ref 0.0–0.5)
Eosinophils Relative: 1 %
HCT: 39.8 % (ref 36.0–46.0)
Hemoglobin: 13 g/dL (ref 12.0–15.0)
Immature Granulocytes: 0 %
Lymphocytes Relative: 25 %
Lymphs Abs: 1.3 10*3/uL (ref 0.7–4.0)
MCH: 30.7 pg (ref 26.0–34.0)
MCHC: 32.7 g/dL (ref 30.0–36.0)
MCV: 94.1 fL (ref 80.0–100.0)
Monocytes Absolute: 0.4 10*3/uL (ref 0.1–1.0)
Monocytes Relative: 7 %
Neutro Abs: 3.5 10*3/uL (ref 1.7–7.7)
Neutrophils Relative %: 67 %
Platelets: 178 10*3/uL (ref 150–400)
RBC: 4.23 MIL/uL (ref 3.87–5.11)
RDW: 12.1 % (ref 11.5–15.5)
WBC: 5.2 10*3/uL (ref 4.0–10.5)
nRBC: 0 % (ref 0.0–0.2)

## 2020-12-28 LAB — BASIC METABOLIC PANEL
Anion gap: 9 (ref 5–15)
BUN: 8 mg/dL (ref 6–20)
CO2: 25 mmol/L (ref 22–32)
Calcium: 8.8 mg/dL — ABNORMAL LOW (ref 8.9–10.3)
Chloride: 105 mmol/L (ref 98–111)
Creatinine, Ser: 0.59 mg/dL (ref 0.44–1.00)
GFR, Estimated: >60 mL/min (ref >60)
Glucose, Bld: 99 mg/dL (ref 70–99)
Potassium: 3.6 mmol/L (ref 3.5–5.1)
Sodium: 139 mmol/L (ref 135–145)

## 2020-12-28 MED ORDER — MORPHINE SULFATE (PF) 4 MG/ML IV SOLN
4.0000 mg | Freq: Once | INTRAVENOUS | Status: AC
Start: 2020-12-28 — End: 2020-12-28
  Administered 2020-12-28: 4 mg via INTRAVENOUS
  Filled 2020-12-28: qty 1

## 2020-12-28 MED ORDER — OXYCODONE-ACETAMINOPHEN 5-325 MG PO TABS: 1.0000 | ORAL_TABLET | Freq: Three times a day (TID) | ORAL | 0 refills | Status: AC | PRN

## 2020-12-28 MED ORDER — MORPHINE SULFATE (PF) 2 MG/ML IV SOLN
2.0000 mg | Freq: Once | INTRAVENOUS | Status: AC
  Administered 2020-12-28: 2 mg via INTRAVENOUS
  Filled 2020-12-28: qty 1

## 2020-12-28 NOTE — Discharge Instructions (Addendum)
You are seen after a fall.  Your lab work and imaging look reassuring.  I am concerned that you may have bruised or fractured your rib, I have given you an incentive spirometer I want you to use this 3 times daily for next 3 weeks as this will help decrease chances of a pneumonia.    I am concerned that you may have bruised or partially tore a ligament in your left shoulder, I would like you to wear a sling for the next 3 to 4 days, you may take off at nighttime.  Please do not continue wearing after that time as I do not want you to freeze yourshoulder.  For pain I have given you a prescription for oxycodone please beware this medication can make you drowsy do not consume alcohol or operate heavy machinery while taking this medication.  This medication has Tylenol in it do not take Tylenol while taking this medication.  You may take ibuprofen as needed please follow dosing the back of bottle.  I want you to follow-up with your orthopedic surgeon about your shoulder if pain does not improve after 4 days times.  I would like to follow-up with your primary care doctor in 3 weeks time for repeat chest x-ray.  Come back to the emergency department if you develop chest pain, shortness of breath, severe abdominal pain, uncontrolled nausea, vomiting, diarrhea.

## 2020-12-28 NOTE — ED Triage Notes (Signed)
Pt reports while out walking her dog she fell onto left side. Pt reports hit left side of face, left rib pain, left shoulder pain and right knee pain.

## 2020-12-28 NOTE — ED Notes (Signed)
Pt out of room for testing. 

## 2020-12-28 NOTE — ED Notes (Signed)
Pt fell while out walking her dog. Pt c/o pain to left shoulder, left rib and right knee. Abrasion noted to left shoulder.

## 2020-12-28 NOTE — ED Provider Notes (Signed)
[facility] EMERGENCY DEPARTMENT Provider Note   CSN: [id number] Arrival date & time: 12/28/20  1093     History Chief Complaint  Patient presents with  . Fall    Vickie Ross is a 55 y.o. female.  HPI   Patient with no significant medical history presents to the emergency department with chief complaint of left-sided face, shoulder, wrist pain.  She endorses this happened today after she fell while walking her dog, she endorsed that her dog dragged onto the pavement, she denies losing conscious, is not on anticoagulant.  She states after the fall she had pain on her left cheek, left shoulder and wrist.  Patient endorses that when she moves her left shoulder she feels pain rating down to her arm, she has occasional paresthesias in her second and third digit.  Patient also endorses that she has some left rib pain, states it hurts when she takes a deep breath, she denies change in vision, paresthesia or weakness the upper lower extremities.  She denies nausea, vomiting, lightheaded or dizziness.  Patient denies  alleviating factors.  Patient denies fevers, chills, shortness of breath, abdominal pain, nausea, vomit, diarrhea, worsening pedal edema.  Past Medical History:  Diagnosis Date  . Allergic rhinitis   . Anxiety   . Asthma     Patient Active Problem List   Diagnosis Date Noted  . Food allergy 08/04/2015  . Chronic rhinitis 08/04/2015  . Mild intermittent asthma 08/04/2015    Past Surgical History:  Procedure Laterality Date  . AUGMENTATION MAMMAPLASTY    . FOOT SURGERY Right 12/2013     OB History   No obstetric history on file.     Family History  Problem Relation Age of Onset  . Asthma Mother   . Allergic rhinitis Mother   . Asthma Son   . Allergic rhinitis Son   . Breast cancer Maternal Aunt 75       x2  . Angioedema Neg Hx   . Eczema Neg Hx   . Immunodeficiency Neg Hx   . Urticaria Neg Hx     Social History   Tobacco Use  . Smoking  status: Never Smoker  . Smokeless tobacco: Never Used  Vaping Use  . Vaping Use: Never used  Substance Use Topics  . Alcohol use: Not Currently    Alcohol/week: 2.0 standard drinks    Types: 1 Glasses of wine, 1 Shots of liquor per week  . Drug use: No    Home Medications Prior to Admission medications   Medication Sig Start Date End Date Taking? Authorizing Provider  oxyCODONE-acetaminophen (PERCOCET/ROXICET) 5-325 MG tablet Take 1-2 tablets by mouth every 8 (eight) hours as needed for up to 5 days for severe pain. 12/28/20 01/02/21 Yes Marcello Fennel, PA-C  albuterol (PROAIR HFA) 108 (90 BASE) MCG/ACT inhaler Inhale 2 puffs into the lungs every 4 (four) hours as needed for wheezing or shortness of breath. Patient not taking: Reported on 10/18/2019 08/04/15   Bobbitt, Sedalia Muta, MD  EPINEPHrine (EPIPEN 2-PAK) 0.3 mg/0.3 mL IJ SOAJ injection USE AS DIRECTED FOR SEVERE ALLERGIC REACTION. Patient not taking: Reported on 10/18/2019 08/04/15   Adelina Mings, MD  HUMIRA PEN 40 MG/0.4ML PNKT  01/22/19   [provider]  Multiple Vitamin (MULTI-VITAMINS) TABS Take by mouth.    [provider]    Allergies    Eluxadoline, Peanut (diagnostic), Poultry meal, Wheat bran, Gluten meal, Grass extracts [gramineae pollens], Influenza vaccines, Eggs or egg-derived products,  and Lactase  Review of Systems   Review of Systems  Constitutional: Negative for chills and fever.  HENT: Negative for congestion.   Eyes: Negative for visual disturbance.  Respiratory: Negative for shortness of breath.   Cardiovascular: Positive for chest pain.  Gastrointestinal: Negative for abdominal pain, diarrhea, nausea and vomiting.  Genitourinary: Negative for enuresis.  Musculoskeletal: Negative for back pain.       Left shoulder and left wrist pain  Skin: Negative for rash.  Neurological: Positive for headaches. Negative for dizziness.  Hematological: Does not bruise/bleed easily.     Physical Exam Updated Vital Signs BP 130/84 (BP Location: Left Arm)   Pulse 78   Temp 98.2 F (36.8 C) (Oral)   Resp 18   Ht 5' 7.5" (1.715 m)   Wt 61.2 kg   SpO2 99%   BMI 20.83 kg/m   Physical Exam Vitals and nursing note reviewed.  Constitutional:      General: She is not in acute distress.    Appearance: She is not ill-appearing.  HENT:     Head: Normocephalic and atraumatic.     Nose: No congestion.  Eyes:     Extraocular Movements: Extraocular movements intact.     Conjunctiva/sclera: Conjunctivae normal.     Pupils: Pupils are equal, round, and reactive to light.  Cardiovascular:     Rate and Rhythm: Normal rate and regular rhythm.     Pulses: Normal pulses.     Heart sounds: No murmur heard. No friction rub. No gallop.   Pulmonary:     Effort: No respiratory distress.     Breath sounds: No wheezing, rhonchi or rales.  Abdominal:     Palpations: Abdomen is soft.     Tenderness: There is no abdominal tenderness.  Musculoskeletal:     Comments: Spine was palpated is nontender to palpation, no step-off deformities present.  Patient has limited range of motion at her left shoulder, unable to fully flex or extend, she has tenderness on the anterior aspect, no deformities present.  Patient has full range of motion her fingers at all joints, she has decreased range of motion at her wrist with flexion extension, no palpable tenderness, neurovascular fully intact.  Patient was slightly tender on her fifth and sixth rib left mid axillary there is no deformities noted.  Skin:    General: Skin is warm and dry.  Neurological:     Mental Status: She is alert.  Psychiatric:        Mood and Affect: Mood normal.     ED Results / Procedures / Treatments   Labs (all labs ordered are listed, but only abnormal results are displayed) Labs Reviewed  BASIC METABOLIC PANEL - Abnormal; Notable for the following components:      Result Value   Calcium 8.8 (*)    All  other components within normal limits  CBC WITH DIFFERENTIAL/PLATELET    EKG None  Radiology DG Ribs Unilateral W/Chest Left  Result Date: 12/28/2020 CLINICAL DATA:  Pt reports while out walking her dog she fell onto left side. Pt reports hit left side of face, left rib pain, left shoulder pain and right knee pain. EXAM: LEFT RIBS AND CHEST - 3+ VIEW COMPARISON:  None. FINDINGS: No displaced fracture or other bone lesions are seen involving the ribs. There is no evidence of pneumothorax or pleural effusion. Both lungs are clear. Heart size and mediastinal contours are within normal limits. IMPRESSION: Negative. Electronically Signed   By: Izora Gala  Dimas Aguas M.D.   On: 12/28/2020 11:58   DG Wrist Complete Left  Result Date: 12/28/2020 CLINICAL DATA:  [place] onto the LEFT side.  Pain. EXAM: LEFT WRIST - COMPLETE 3+ VIEW COMPARISON:  None. FINDINGS: Patient has had resection of the triquetrum. There is minimal irregularity at the base of the first metacarpal, possibly chronic and postoperative. There is no acute fracture or subluxation. Bones appear radiolucent. IMPRESSION: 1. No evidence for acute abnormality. 2. Prior resection of the triquetrum. Electronically Signed   By: Nolon Nations M.D.   On: 12/28/2020 12:13   CT Head Wo Contrast  Result Date: 12/28/2020 CLINICAL DATA:  Facial trauma. EXAM: CT HEAD WITHOUT CONTRAST CT MAXILLOFACIAL WITHOUT CONTRAST TECHNIQUE: Multidetector CT imaging of the head and maxillofacial structures were performed using the standard protocol without intravenous contrast. Multiplanar CT image reconstructions of the maxillofacial structures were also generated. COMPARISON:  Head CT dated 07/25/2019 FINDINGS: CT HEAD FINDINGS Brain: Ventricles are normal in size and configuration. There is no mass, hemorrhage, edema or other evidence of acute parenchymal abnormality. The no extra-axial hemorrhage. Vascular: Chronic calcified atherosclerotic changes of the large vessels  at the skull base. No unexpected hyperdense vessel. Skull: Normal. Negative for fracture or focal lesion. Other: None. CT MAXILLOFACIAL FINDINGS Osseous: Lower frontal bones are intact and normally aligned. Slightly displaced nasal bone fracture anteriorly, to LEFT of midline, of uncertain age. Osseous structures about the orbits are intact and normally aligned bilaterally. Walls of the maxillary sinuses are intact and appropriately aligned. Bilateral zygomatic arches and pterygoid plates are intact. No mandible fracture or displacement. No obvious tooth fracture or dislodgement. Orbits: Periorbital and retro-orbital soft tissues are unremarkable. Sinuses: Clear. Soft tissues: No circumscribed soft tissue hematoma. IMPRESSION: 1. Slightly displaced nasal bone fracture anteriorly, of uncertain age. 2. No other facial bone fracture or displacement. 3. No acute intracranial abnormality. No intracranial mass, hemorrhage or edema. No skull fracture. Electronically Signed   By: Franki Cabot M.D.   On: 12/28/2020 12:11   DG Shoulder Left  Result Date: 12/28/2020 CLINICAL DATA:  Pt reports while out walking her dog she fell onto left side. Pt reports hit left side of face, left rib pain, left shoulder pain and right knee pain. EXAM: LEFT SHOULDER - 2+ VIEW COMPARISON:  None. FINDINGS: There is no evidence of fracture or dislocation. There is no evidence of arthropathy or other focal bone abnormality. Soft tissues are unremarkable. IMPRESSION: Negative. Electronically Signed   By: Audie Pinto M.D.   On: 12/28/2020 11:55   CT Maxillofacial WO CM  Result Date: 12/28/2020 CLINICAL DATA:  Facial trauma. EXAM: CT HEAD WITHOUT CONTRAST CT MAXILLOFACIAL WITHOUT CONTRAST TECHNIQUE: Multidetector CT imaging of the head and maxillofacial structures were performed using the standard protocol without intravenous contrast. Multiplanar CT image reconstructions of the maxillofacial structures were also generated. COMPARISON:   Head CT dated 07/25/2019 FINDINGS: CT HEAD FINDINGS Brain: Ventricles are normal in size and configuration. There is no mass, hemorrhage, edema or other evidence of acute parenchymal abnormality. The no extra-axial hemorrhage. Vascular: Chronic calcified atherosclerotic changes of the large vessels at the skull base. No unexpected hyperdense vessel. Skull: Normal. Negative for fracture or focal lesion. Other: None. CT MAXILLOFACIAL FINDINGS Osseous: Lower frontal bones are intact and normally aligned. Slightly displaced nasal bone fracture anteriorly, to LEFT of midline, of uncertain age. Osseous structures about the orbits are intact and normally aligned bilaterally. Walls of the maxillary sinuses are intact and appropriately aligned. Bilateral zygomatic arches and  pterygoid plates are intact. No mandible fracture or displacement. No obvious tooth fracture or dislodgement. Orbits: Periorbital and retro-orbital soft tissues are unremarkable. Sinuses: Clear. Soft tissues: No circumscribed soft tissue hematoma. IMPRESSION: 1. Slightly displaced nasal bone fracture anteriorly, of uncertain age. 2. No other facial bone fracture or displacement. 3. No acute intracranial abnormality. No intracranial mass, hemorrhage or edema. No skull fracture. Electronically Signed   By: Franki Cabot M.D.   On: 12/28/2020 12:11    Procedures Procedures   Medications Ordered in ED Medications  morphine 4 MG/ML injection 4 mg (4 mg Intravenous Given 12/28/20 1217)  morphine 2 MG/ML injection 2 mg (2 mg Intravenous Given 12/28/20 1349)    ED Course  I have reviewed the triage vital signs and the nursing notes.  Pertinent labs & imaging results that were available during my care of the patient were reviewed by me and considered in my medical decision making (see chart for details).    MDM Rules/Calculators/A&P                         Initial impression-patient presents after mechanical fall with left-sided cheek pain,  left shoulder, left wrist pain.  She is alert, does not appear in acute distress, to signs reassuring.  Will obtain CT face, head, x-ray of the shoulder, wrist, chest x-ray provide patient with pain medication and reassess.  Work-up-CT head negative for acute findings, CT maxillofacial shows slightly displaced nasal bone fracture anteriorly uncertain age, x-ray of ribs negative, x-ray of left shoulder negative, x-ray of left wrist is negative.  CBC unremarkable, CMP unremarkable  Reassessment patient was reassessed after providing her with morphine, states she is feeling much better, has no complaints this time.  She was updated on imaging, patient agreed for discharge at this time.  Rule out-low suspicion for intracranial head bleed or fracture as there is no deformities on my exam, no neuro deficits present, patient does have noted possible nasal bone fracture but I suspect this is an old fracture/ overread as she has no tenderness to palpation along her nasal bone, she has broken this bone in the past.  Low suspicion for spinal cord abnormality or spinal fracture or spinal cord abnormality as spine is nontender to palpation, patient is moving all 4 extremities.  Low suspicion for rib fracture or pneumothorax as lung sounds are clear bilaterally, x-rays negative for acute findings.  Patient is noted to be tender on her left rib, possibly she might have an occult fracture.  Low suspicion for fracture of the left upper extremity as x-ray is negative for acute findings.  She does have noted decreased range of motion her left shoulder possible she may have a muscular strain versus a ligament damage.  Plan-  1.  Left shoulder pain-I suspect this is a muscular strain but cannot rule out ligament damage, will have her in a sling for the next 4 days follow-up with orthopedic surgery if it does not improve.  2.  Left rib pain suspect this is a bruised ribs but cannot rule out occult fracture, will treat as if  it were fractured.  Provide her with incentive spirometry, prior with narcotics, follow-up with PCP in 3 weeks time for reassessment.   Vital signs have remained stable, no indication for hospital admission.  Patient given at home care as well strict return precautions.  Patient verbalized that they understood agreed to said plan.   Final Clinical Impression(s) / ED Diagnoses  Final diagnoses:  Fall, initial encounter  Acute pain of left shoulder  Rib pain on left side    Rx / DC Orders ED Discharge Orders         Ordered    oxyCODONE-acetaminophen (PERCOCET/ROXICET) 5-325 MG tablet  Every 8 hours PRN        12/28/20 1343           Marcello Fennel, PA-C 12/28/20 1436    Gareth Morgan, MD 12/30/20 [phone]

## 2021-01-16 ENCOUNTER — Ambulatory Visit: Payer: PRIVATE HEALTH INSURANCE

## 2021-03-06 ENCOUNTER — Ambulatory Visit: Payer: PRIVATE HEALTH INSURANCE

## 2021-03-12 ENCOUNTER — Ambulatory Visit
Admission: RE | Admit: 2021-03-12 | Discharge: 2021-03-12 | Disposition: A | Discharge status: Home / Self Care | Payer: PRIVATE HEALTH INSURANCE | Source: Ambulatory Visit | Attending: Internal Medicine | Admitting: Internal Medicine

## 2021-03-12 ENCOUNTER — Other Ambulatory Visit: Payer: Self-pay

## 2021-03-12 DIAGNOSIS — Z1231 Encounter for screening mammogram for malignant neoplasm of breast: Secondary | ICD-10-CM

## 2021-07-21 ENCOUNTER — Encounter: Payer: Self-pay | Admitting: Physical Therapy

## 2021-07-21 ENCOUNTER — Other Ambulatory Visit: Payer: Self-pay

## 2021-07-21 ENCOUNTER — Ambulatory Visit: Payer: No Typology Code available for payment source | Attending: Orthopaedic Surgery | Admitting: Physical Therapy

## 2021-07-21 DIAGNOSIS — M62838 Other muscle spasm: Secondary | ICD-10-CM | POA: Diagnosis present

## 2021-07-21 DIAGNOSIS — M25512 Pain in left shoulder: Secondary | ICD-10-CM | POA: Diagnosis present

## 2021-07-21 DIAGNOSIS — M25612 Stiffness of left shoulder, not elsewhere classified: Secondary | ICD-10-CM | POA: Diagnosis present

## 2021-07-21 DIAGNOSIS — M6281 Muscle weakness (generalized): Secondary | ICD-10-CM | POA: Insufficient documentation

## 2021-07-21 NOTE — Patient Instructions (Signed)
Access Code: [email or web address] URL: [email or web address] Date: 07/21/2021 Prepared by: Glenetta Hew  Exercises Seated Cervical Retraction - 1 x daily - 7 x weekly - 2 sets - 10 reps Seated Scapular Retraction - 1 x daily - 7 x weekly - 2 sets - 10 reps Seated Shoulder Rolls - 1 x daily - 7 x weekly - 2 sets - 10 reps Supine Shoulder Flexion Extension Full Range AROM - 1 x daily - 7 x weekly - 2 sets - 10 reps Sidelying Shoulder External Rotation - 1 x daily - 7 x weekly - 2 sets - 10 reps
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
## 2021-07-28 ENCOUNTER — Ambulatory Visit: Payer: No Typology Code available for payment source

## 2021-08-03 ENCOUNTER — Ambulatory Visit: Payer: No Typology Code available for payment source

## 2021-08-03 ENCOUNTER — Other Ambulatory Visit: Payer: Self-pay

## 2021-08-03 DIAGNOSIS — M25512 Pain in left shoulder: Secondary | ICD-10-CM

## 2021-08-03 DIAGNOSIS — M25612 Stiffness of left shoulder, not elsewhere classified: Secondary | ICD-10-CM

## 2021-08-03 DIAGNOSIS — M6281 Muscle weakness (generalized): Secondary | ICD-10-CM

## 2021-08-03 NOTE — Therapy (Signed)
[facility] [address]  [place], [place], [address] Phone: [phone]   Fax:  [phone]  Physical Therapy Treatment  Patient Details  Name: Vickie Ross MRN: [id number] Date of Birth: 1966-08-03 Referring Provider (PT): Melrose Nakayama, MD   Encounter Date: 08/03/2021   PT End of Session - 08/03/21 1845     Visit Number 2    Number of Visits 12    Date for PT Re-Evaluation 09/01/21    PT Start Time 6160    PT Stop Time 1743    PT Time Calculation (min) 38 min    Activity Tolerance Patient tolerated treatment well    Behavior During Therapy [facility] for tasks assessed/performed             Past Medical History:  Diagnosis Date   Allergic rhinitis    Anxiety    Asthma     Past Surgical History:  Procedure Laterality Date   AUGMENTATION MAMMAPLASTY     FOOT SURGERY Right 12/2013    There were no vitals filed for this visit.   Subjective Assessment - 08/03/21 1708     Subjective "Got a shot 3 weeks ago, my shoulder doesn't feel bad right now but when I try to move it out it hurts."    Pertinent History osteopenia, C7/T1 compression fractures and ligamentous injury 2021, concussion    Diagnostic tests none    Patient Stated Goals help with pain in L shoulder    Currently in Pain? No/denies                               [facility] Adult PT Treatment/Exercise - 08/03/21 0001       Shoulder Exercises: Sidelying   External Rotation Strengthening;Left;10 reps;Weights    External Rotation Limitations 1      Shoulder Exercises: Standing   External Rotation Strengthening;Both;10 reps    Theraband Level (Shoulder External Rotation) Level 2 (Red)    Extension Strengthening;Both;20 reps;Theraband    Theraband Level (Shoulder Extension) Level 2 (Red)    Row Strengthening;Both;20 reps;Theraband    Theraband Level (Shoulder Row) Level 2 (Red)      Shoulder Exercises: ROM/Strengthening    UBE (Upper Arm Bike) L1.10min each way    Wall Wash B into scaption 20 reps, B CW/CCW circles 10 reps each      Shoulder Exercises: Stretch   Other Shoulder Stretches B UT stretches 2x30"                     PT Education - 08/03/21 1846     Education Details HEP progression    Person(s) Educated Patient    Methods Explanation;Demonstration;Handout    Comprehension Verbalized understanding;Returned demonstration              PT Short Term Goals - 08/03/21 1846       PT SHORT TERM GOAL #1   Title Patient to be independent with initial HEP.    Time 2    Period Weeks    Status Achieved   08/03/21   Target Date 08/04/21               PT Long Term Goals - 08/03/21 1846       PT LONG TERM GOAL #1   Title Patient to be independent with advanced HEP.    Time 6    Period Weeks  Status On-going      PT LONG TERM GOAL #2   Title Patient to demonstrate L shoulder AROM WFL and without pain limiting.    Time 6    Period Weeks    Status On-going      PT LONG TERM GOAL #3   Title Patient will be able to complete all ADLs and sleep without L shoulder pain.    Baseline modifies how she dresses due to pain    Time 6    Period Weeks    Status On-going      PT LONG TERM GOAL #4   Title Patient will be able to lift 10lb overhead without pain to be able to return to gym based exercise.    Time 6    Period Weeks    Status On-going      PT LONG TERM GOAL #5   Title Patient to report 75% improvement in pain levels.    Time 6    Period Weeks    Status On-going                   Plan - 08/03/21 1847     Clinical Impression Statement Pt able to complete all exercises today w/o any increased pain. Progressed HEP to her tolerance today. Instructions required with the RTC strengthening exercises for form. The only pain she reports is when she is reaching into abduction. Instructions given for sidelying ER exercises at home to use small weighted  objects. Good response to treatment today. Pt expressed interested in DN post session.    Personal Factors and Comorbidities Age;Comorbidity 3+;Past/Current Experience;Profession;Time since onset of injury/illness/exacerbation    Comorbidities osteopenia, C7/T1 compression fractures and ligamentous injury 2021, concussion    PT Frequency 2x / week    PT Duration 6 weeks    PT Treatment/Interventions ADLs/Self Care Home Management;Cryotherapy;Electrical Stimulation;Moist Heat;Therapeutic exercise;Therapeutic activities;Functional mobility training;Ultrasound;Neuromuscular re-education;Patient/family education;Manual techniques;Vasopneumatic Device;Taping;Dry needling;Passive range of motion;Iontophoresis 4mg /ml Dexamethasone;Spinal Manipulations;Joint Manipulations    PT Next Visit Plan DN, postural exercises, manual (dry needling) L shoulder    PT Home Exercise Plan Access Code: [email or web address]    Consulted and Agree with Plan of Care Patient             Patient will benefit from skilled therapeutic intervention in order to improve the following deficits and impairments:  Hypomobility, Decreased activity tolerance, Decreased strength, Increased fascial restricitons, Pain, Impaired UE functional use, Improper body mechanics, Decreased range of motion, Postural dysfunction, Impaired flexibility, Increased muscle spasms  Visit Diagnosis: Acute pain of left shoulder  Stiffness of left shoulder, not elsewhere classified  Muscle weakness (generalized)     Problem List Patient Active Problem List   Diagnosis Date Noted   Food allergy 08/04/2015   Chronic rhinitis 08/04/2015   Mild intermittent asthma 08/04/2015    Artist Pais, PTA 08/03/2021, 6:53 PM  [facility] [address]  [place], [place], [address] Phone: [phone]   Fax:  [phone]  Name: Vickie Ross MRN: [id number] Date of Birth: 06-29-1966

## 2021-08-10 ENCOUNTER — Encounter: Payer: Self-pay | Admitting: Physical Therapy

## 2021-08-10 ENCOUNTER — Ambulatory Visit: Payer: No Typology Code available for payment source | Admitting: Physical Therapy

## 2021-08-10 ENCOUNTER — Other Ambulatory Visit: Payer: Self-pay

## 2021-08-10 DIAGNOSIS — M6281 Muscle weakness (generalized): Secondary | ICD-10-CM

## 2021-08-10 DIAGNOSIS — M25512 Pain in left shoulder: Secondary | ICD-10-CM

## 2021-08-10 DIAGNOSIS — M25612 Stiffness of left shoulder, not elsewhere classified: Secondary | ICD-10-CM

## 2021-08-10 DIAGNOSIS — M62838 Other muscle spasm: Secondary | ICD-10-CM
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
## 2021-08-10 NOTE — Patient Instructions (Signed)

## 2021-08-13 ENCOUNTER — Ambulatory Visit: Payer: No Typology Code available for payment source | Attending: Orthopaedic Surgery

## 2021-08-13 ENCOUNTER — Other Ambulatory Visit: Payer: Self-pay

## 2021-08-13 DIAGNOSIS — M6281 Muscle weakness (generalized): Secondary | ICD-10-CM | POA: Insufficient documentation

## 2021-08-13 DIAGNOSIS — M25612 Stiffness of left shoulder, not elsewhere classified: Secondary | ICD-10-CM | POA: Insufficient documentation

## 2021-08-13 DIAGNOSIS — M62838 Other muscle spasm: Secondary | ICD-10-CM | POA: Insufficient documentation

## 2021-08-13 DIAGNOSIS — M25512 Pain in left shoulder: Secondary | ICD-10-CM | POA: Diagnosis present

## 2021-08-13 NOTE — Therapy (Signed)
[facility] [address]  [place], [place], [address] Phone: [phone]   Fax:  [phone]  Physical Therapy Treatment  Patient Details  Name: Vickie Ross MRN: [id number] Date of Birth: 09-Jun-1966 Referring Provider (PT): Melrose Nakayama, MD   Encounter Date: 08/13/2021   PT End of Session - 08/13/21 1749     Visit Number 4    Number of Visits 12    Date for PT Re-Evaluation 09/01/21    PT Start Time 1703    PT Stop Time 1746    PT Time Calculation (min) 43 min    Activity Tolerance Patient tolerated treatment well    Behavior During Therapy [facility] for tasks assessed/performed             Past Medical History:  Diagnosis Date   Allergic rhinitis    Anxiety    Asthma     Past Surgical History:  Procedure Laterality Date   AUGMENTATION MAMMAPLASTY     FOOT SURGERY Right 12/2013    There were no vitals filed for this visit.   Subjective Assessment - 08/13/21 1705     Subjective Pt feels like she does not need too much more therapy because her shoulder doesn't    Pertinent History osteopenia, C7/T1 compression fractures and ligamentous injury 2021, concussion    Diagnostic tests none    Patient Stated Goals help with pain in L shoulder                               [facility] Adult PT Treatment/Exercise - 08/13/21 0001       Shoulder Exercises: Sidelying   External Rotation Strengthening;Left;20 reps;Weights    External Rotation Weight (lbs) 2    ABduction Strengthening;Left;15 reps;Weights    ABduction Weight (lbs) 2      Shoulder Exercises: Standing   Flexion Strengthening;Both;20 reps;Weights    Shoulder Flexion Weight (lbs) 2    ABduction Strengthening;Both;20 reps;Weights    Shoulder ABduction Weight (lbs) 2      Shoulder Exercises: ROM/Strengthening   UBE (Upper Arm Bike) L1.105mn each way    Lat Pull Limitations 20lb 2x10, 10lb 2x10 standing pulldown    Cybex Row  Limitations 20lb 2x10                       PT Short Term Goals - 08/03/21 1846       PT SHORT TERM GOAL #1   Title Patient to be independent with initial HEP.    Time 2    Period Weeks    Status Achieved   08/03/21   Target Date 08/04/21               PT Long Term Goals - 08/13/21 1748       PT LONG TERM GOAL #1   Title Patient to be independent with advanced HEP.    Time 6    Period Weeks    Status On-going      PT LONG TERM GOAL #2   Title Patient to demonstrate L shoulder AROM WFL and without pain limiting.    Time 6    Period Weeks    Status On-going      PT LONG TERM GOAL #3   Title Patient will be able to complete all ADLs and sleep without L shoulder pain.    Baseline modifies how she dresses due  to pain    Time 6    Period Weeks    Status Partially Met   able to sleep w/o pain     PT LONG TERM GOAL #4   Title Patient will be able to lift 10lb overhead without pain to be able to return to gym based exercise.    Time 6    Period Weeks    Status On-going      PT LONG TERM GOAL #5   Title Patient to report 75% improvement in pain levels.    Time 6    Period Weeks    Status Achieved   08/13/21 - 80% improvement                  Plan - 08/13/21 1749     Clinical Impression Statement Pt tolerated the exercises well with no increased pain. Reviewed gym equipment with her today to alllow for smooth return to gym routine. Only intermittnent cues needed with the exercises for form. Gave her instructions on positions of the shoulder to avoid especially full ABD because it seems to bring about the most pain. She wishes to transition to HEP soon due to work schedule and low pain levels. She is now able to sleep w/o shoulder pain and reports 80% improvement in overall pain. LTG 5 is fully met and LTG 3 partially met.    Personal Factors and Comorbidities Age;Comorbidity 3+;Past/Current Experience;Profession;Time since onset of  injury/illness/exacerbation    Comorbidities osteopenia, C7/T1 compression fractures and ligamentous injury 2021, concussion    PT Frequency 2x / week    PT Duration 6 weeks    PT Treatment/Interventions ADLs/Self Care Home Management;Cryotherapy;Electrical Stimulation;Moist Heat;Therapeutic exercise;Therapeutic activities;Functional mobility training;Ultrasound;Neuromuscular re-education;Patient/family education;Manual techniques;Vasopneumatic Device;Taping;Dry needling;Passive range of motion;Iontophoresis 89m/ml Dexamethasone;Spinal Manipulations;Joint Manipulations    PT Next Visit Plan DN, postural exercises, manual (dry needling) L shoulder    PT Home Exercise Plan Access Code: [email or web address]    Consulted and Agree with Plan of Care Patient             Patient will benefit from skilled therapeutic intervention in order to improve the following deficits and impairments:  Hypomobility, Decreased activity tolerance, Decreased strength, Increased fascial restricitons, Pain, Impaired UE functional use, Improper body mechanics, Decreased range of motion, Postural dysfunction, Impaired flexibility, Increased muscle spasms  Visit Diagnosis: Acute pain of left shoulder  Stiffness of left shoulder, not elsewhere classified  Muscle weakness (generalized)     Problem List Patient Active Problem List   Diagnosis Date Noted   Food allergy 08/04/2015   Chronic rhinitis 08/04/2015   Mild intermittent asthma 08/04/2015    BArtist Pais PTA 08/13/2021, 6:07 PM  [facility] [address] [place] [address]Phone: [phone]  Fax:  [phone] Name: Vickie TietjeMRN: [id number] of Birth: 7February 15, 1967

## 2021-08-17 ENCOUNTER — Other Ambulatory Visit: Payer: Self-pay

## 2021-08-17 ENCOUNTER — Encounter: Payer: Self-pay | Admitting: Physical Therapy

## 2021-08-17 ENCOUNTER — Ambulatory Visit: Payer: No Typology Code available for payment source | Admitting: Physical Therapy

## 2021-08-17 DIAGNOSIS — M6281 Muscle weakness (generalized): Secondary | ICD-10-CM

## 2021-08-17 DIAGNOSIS — M25512 Pain in left shoulder: Secondary | ICD-10-CM

## 2021-08-17 DIAGNOSIS — M25612 Stiffness of left shoulder, not elsewhere classified: Secondary | ICD-10-CM

## 2021-08-17 DIAGNOSIS — M62838 Other muscle spasm: Secondary | ICD-10-CM

## 2021-08-17 NOTE — Therapy (Signed)
[facility] [address]  [place], [place], [address] Phone: [phone]   Fax:  [phone]  Physical Therapy Treatment/Discharge  PHYSICAL THERAPY DISCHARGE SUMMARY  Visits from Start of Care: 5  Current functional level related to goals / functional outcomes: All goals met, FOTO 75%   Remaining deficits: Mostly difficulty with sudden jerky movements.  See below   Education / Equipment: HEP  Plan: Patient agrees to discharge.  Patient is being discharged due to meeting the stated rehab goals.       Patient Details  Name: Vickie Ross MRN: [id number] Date of Birth: Jan 08, 1966 Referring Provider (PT): Melrose Nakayama, MD   Encounter Date: 08/17/2021   PT End of Session - 08/17/21 1741     Visit Number 5    Number of Visits 12    Date for PT Re-Evaluation 09/01/21    PT Start Time 1702    PT Stop Time 1752    PT Time Calculation (min) 50 min    Activity Tolerance Patient tolerated treatment well    Behavior During Therapy [facility] for tasks assessed/performed             Past Medical History:  Diagnosis Date   Allergic rhinitis    Anxiety    Asthma     Past Surgical History:  Procedure Laterality Date   AUGMENTATION MAMMAPLASTY     FOOT SURGERY Right 12/2013    There were no vitals filed for this visit.   Subjective Assessment - 08/17/21 1700     Subjective Patient reports she has started working out at gym again.  She feels ready for discharge today.  She is able to sleep on her side and do everything without pain, except for sudden jerky movements.    Pertinent History osteopenia, C7/T1 compression fractures and ligamentous injury 2021, concussion    Diagnostic tests none    Patient Stated Goals help with pain in L shoulder    Pain Score 0-No pain                [facility] PT Assessment - 08/17/21 0001       Assessment   Medical Diagnosis L shoulder pain    Referring Provider (PT)  Melrose Nakayama, MD    Hand Dominance Right      Precautions   Precautions None      Restrictions   Weight Bearing Restrictions No      Observation/Other Assessments   Focus on Therapeutic Outcomes (FOTO)  75      ROM / Strength   AROM / PROM / Strength AROM      AROM   Overall AROM  Within functional limits for tasks performed    Overall AROM Comments full symmetric shoulder ROM bilaterally without pain.      Strength   Overall Strength Within functional limits for tasks performed    Overall Strength Comments tested in sitting    Right Shoulder Flexion 5/5    Right Shoulder ABduction 5/5    Right Shoulder Internal Rotation 5/5    Right Shoulder External Rotation 5/5    Left Shoulder Flexion 5/5    Left Shoulder ABduction 5/5    Left Shoulder Internal Rotation 5/5    Left Shoulder External Rotation 5/5                           [facility] Adult PT Treatment/Exercise - 08/17/21 0001  Exercises   Exercises Shoulder      Shoulder Exercises: ROM/Strengthening   UBE (Upper Arm Bike) L1.48mn each way    Lat Pull Limitations 20lb 2 x 10    Cybex Press Limitations overhead press 5# x 10 no pain    Cybex Row Limitations 20# 2 x 10    Plank 1 rep;30 seconds    Other ROM/Strengthening Exercises reviewed HEP and answered questions on exercises, machines, appropriate resistance, recommended start speed bag drills starting low, progressing to overhead, before using actual speedbag      Manual Therapy   Manual Therapy Soft tissue mobilization;Other (comment)    Soft tissue mobilization TPR to anterior deltoid, lats    Other Manual Therapy dry needling              Trigger Point Dry Needling - 08/17/21 0001     Consent Given? Yes    Education Handout Provided Previously provided    Muscles Treated Upper Quadrant Latissimus dorsi;Deltoid   Left   Dry Needling Comments in supine    Deltoid Response Twitch response elicited;Palpable increased muscle length     Latissimus dorsi Response Twitch response elicited;Palpable increased muscle length                     PT Short Term Goals - 08/03/21 1846       PT SHORT TERM GOAL #1   Title Patient to be independent with initial HEP.    Time 2    Period Weeks    Status Achieved   08/03/21   Target Date 08/04/21               PT Long Term Goals - 08/17/21 1743       PT LONG TERM GOAL #1   Title Patient to be independent with advanced HEP.    Time 6    Period Weeks    Status Achieved      PT LONG TERM GOAL #2   Title Patient to demonstrate L shoulder AROM WFL and without pain limiting.    Time 6    Period Weeks    Status Achieved      PT LONG TERM GOAL #3   Title Patient will be able to complete all ADLs and sleep without L shoulder pain.    Baseline modifies how she dresses due to pain    Time 6    Period Weeks    Status Partially Met   able to sleep w/o pain, has to turn bra around   Target Date 09/01/21      PT LONG TERM GOAL #4   Title Patient will be able to lift 10lb overhead without pain to be able to return to gym based exercise.    Time 6    Period Weeks    Status Achieved      PT LONG TERM GOAL #5   Title Patient to report 75% improvement in pain levels.    Time 6    Period Weeks    Status Achieved   08/13/21 - 80% improvement                  Plan - 08/17/21 1757     Clinical Impression Statement Patient has made good progress and reports no pain with L shoulder movements, has returned to gym based exercise program.  She does report some soreness over lat insertion on upper arm, and had some trigger points in this area in  ant delt as well, after STM and DN demonstrated decreased pain and improved ROM, able to reach behind back to bra level without pain.  On FOTO score 75% from 63% on IE.  She feels ready and comfortable for discharge today and has met all goals.  Focus of today's session was on education on progressing HEP and gym based  exercise safely.    Personal Factors and Comorbidities Age;Comorbidity 3+;Past/Current Experience;Profession;Time since onset of injury/illness/exacerbation    Comorbidities osteopenia, C7/T1 compression fractures and ligamentous injury 2021, concussion    PT Frequency 2x / week    PT Duration 6 weeks    PT Treatment/Interventions ADLs/Self Care Home Management;Cryotherapy;Electrical Stimulation;Moist Heat;Therapeutic exercise;Therapeutic activities;Functional mobility training;Ultrasound;Neuromuscular re-education;Patient/family education;Manual techniques;Vasopneumatic Device;Taping;Dry needling;Passive range of motion;Iontophoresis 36m/ml Dexamethasone;Spinal Manipulations;Joint Manipulations    PT Next Visit Plan discharge to HEP    PT Home Exercise Plan Access Code: [email or web address]    Consulted and Agree with Plan of Care Patient             Patient will benefit from skilled therapeutic intervention in order to improve the following deficits and impairments:  Hypomobility, Decreased activity tolerance, Decreased strength, Increased fascial restricitons, Pain, Impaired UE functional use, Improper body mechanics, Decreased range of motion, Postural dysfunction, Impaired flexibility, Increased muscle spasms  Visit Diagnosis: Acute pain of left shoulder  Stiffness of left shoulder, not elsewhere classified  Muscle weakness (generalized)  Other muscle spasm     Problem List Patient Active Problem List   Diagnosis Date Noted   Food allergy 08/04/2015   Chronic rhinitis 08/04/2015   Mild intermittent asthma 08/04/2015    ERennie Natter PT, DPT  08/17/2021, 6:11 PM  [facility] [place] [address]Phone: [phone]  Fax:  [phone] Name: JCarin ShippMRN: [id number] of Birth: 7March 08, 1967

## 2021-08-24 ENCOUNTER — Encounter: Payer: No Typology Code available for payment source | Admitting: Physical Therapy

## 2021-08-26 ENCOUNTER — Ambulatory Visit: Payer: No Typology Code available for payment source | Admitting: Physical Therapy

## 2021-08-31 ENCOUNTER — Encounter: Payer: No Typology Code available for payment source | Admitting: Physical Therapy

## 2022-01-20 ENCOUNTER — Other Ambulatory Visit: Payer: Self-pay | Admitting: Internal Medicine

## 2022-01-20 DIAGNOSIS — Z1231 Encounter for screening mammogram for malignant neoplasm of breast: Secondary | ICD-10-CM

## 2022-03-19 ENCOUNTER — Ambulatory Visit: Payer: No Typology Code available for payment source

## 2022-03-26 ENCOUNTER — Other Ambulatory Visit: Payer: Self-pay | Admitting: Obstetrics and Gynecology

## 2022-03-26 ENCOUNTER — Ambulatory Visit
Admission: RE | Admit: 2022-03-26 | Discharge: 2022-03-26 | Disposition: A | Discharge status: Home / Self Care | Payer: PRIVATE HEALTH INSURANCE | Source: Ambulatory Visit | Attending: Internal Medicine | Admitting: Internal Medicine

## 2022-03-26 DIAGNOSIS — Z1231 Encounter for screening mammogram for malignant neoplasm of breast: Secondary | ICD-10-CM

## 2023-10-31 ENCOUNTER — Emergency Department (HOSPITAL_COMMUNITY): Payer: PRIVATE HEALTH INSURANCE

## 2023-10-31 ENCOUNTER — Emergency Department (HOSPITAL_COMMUNITY)
Admission: EM | Admit: 2023-10-31 | Discharge: 2023-10-31 | Disposition: A | Discharge status: Home / Self Care | Payer: PRIVATE HEALTH INSURANCE | Attending: Emergency Medicine | Admitting: Emergency Medicine

## 2023-10-31 DIAGNOSIS — J45909 Unspecified asthma, uncomplicated: Secondary | ICD-10-CM | POA: Diagnosis not present

## 2023-10-31 DIAGNOSIS — Z91010 Allergy to peanuts: Secondary | ICD-10-CM | POA: Diagnosis not present

## 2023-10-31 DIAGNOSIS — R079 Chest pain, unspecified: Secondary | ICD-10-CM | POA: Diagnosis present

## 2023-10-31 DIAGNOSIS — M940 Chondrocostal junction syndrome [Tietze]: Secondary | ICD-10-CM | POA: Insufficient documentation

## 2023-10-31 LAB — CBC
HCT: 36.9 % (ref 36.0–46.0)
Hemoglobin: 12.2 g/dL (ref 12.0–15.0)
MCH: 31 pg (ref 26.0–34.0)
MCHC: 33.1 g/dL (ref 30.0–36.0)
MCV: 93.7 fL (ref 80.0–100.0)
Platelets: 118 10*3/uL — ABNORMAL LOW (ref 150–400)
RBC: 3.94 MIL/uL (ref 3.87–5.11)
RDW: 13.4 % (ref 11.5–15.5)
WBC: 1.5 10*3/uL — ABNORMAL LOW (ref 4.0–10.5)
nRBC: 0 % (ref 0.0–0.2)

## 2023-10-31 LAB — BASIC METABOLIC PANEL
Anion gap: 8 (ref 5–15)
BUN: 13 mg/dL (ref 6–20)
CO2: 27 mmol/L (ref 22–32)
Calcium: 8.6 mg/dL — ABNORMAL LOW (ref 8.9–10.3)
Chloride: 105 mmol/L (ref 98–111)
Creatinine, Ser: 0.77 mg/dL (ref 0.44–1.00)
GFR, Estimated: >60 mL/min (ref >60)
Glucose, Bld: 106 mg/dL — ABNORMAL HIGH (ref 70–99)
Potassium: 4.1 mmol/L (ref 3.5–5.1)
Sodium: 140 mmol/L (ref 135–145)

## 2023-10-31 LAB — TROPONIN I (HIGH SENSITIVITY)
Troponin I (High Sensitivity): 3 ng/L (ref <18)
Troponin I (High Sensitivity): 3 ng/L (ref <18)

## 2023-10-31 MED ORDER — PREDNISONE 20 MG PO TABS: 40.0000 mg | ORAL_TABLET | Freq: Every day | ORAL | 0 refills | Status: AC

## 2023-10-31 MED ORDER — IOHEXOL 350 MG/ML SOLN
75.0000 mL | Freq: Once | INTRAVENOUS | Status: AC | PRN
  Administered 2023-10-31: 75 mL via INTRAVENOUS

## 2023-10-31 MED ORDER — BENZONATATE 100 MG PO CAPS: 100.0000 mg | ORAL_CAPSULE | Freq: Three times a day (TID) | ORAL | 0 refills | Status: AC | PRN

## 2023-10-31 NOTE — ED Notes (Signed)
 Blue top sent to lab.

## 2023-10-31 NOTE — ED Provider Notes (Signed)
[place] EMERGENCY DEPARTMENT AT [facility] Provider Note   CSN: [id number] Arrival date & time: 10/31/23  1941     History  Chief Complaint  Patient presents with   Cough   Chest Pain    Vickie Ross is a 58 y.o. female.  58 year old female with a history of asthma, anxiety presents to the emergency department for evaluation of chest pain.  She reports experiencing a sharp pain in her central chest with coughing, deep breathing.  She has had a cough for the past few days with some mild shortness of breath.  Has been utilizing her albuterol inhaler with some relief.  Has not taken any medications for her pain.  Was evaluated for this at urgent care and was advised to come to the emergency department due to an abnormality on her chest x-ray.  She has not had any fevers, syncope, hemoptysis, leg swelling, recent surgeries or hospitalizations.  The history is provided by the patient. No language interpreter was used.  Cough Associated symptoms: chest pain   Chest Pain Associated symptoms: cough        Home Medications Prior to Admission medications   Medication Sig Start Date End Date Taking? Authorizing Provider  benzonatate (TESSALON) 100 MG capsule Take 1 capsule (100 mg total) by mouth 3 (three) times daily as needed for cough. 10/31/23  Yes Antony Madura, PA-C  predniSONE (DELTASONE) 20 MG tablet Take 2 tablets (40 mg total) by mouth daily. 10/31/23  Yes Antony Madura, PA-C  albuterol (PROAIR HFA) 108 (90 BASE) MCG/ACT inhaler Inhale 2 puffs into the lungs every 4 (four) hours as needed for wheezing or shortness of breath. Patient not taking: Reported on 10/18/2019 08/04/15   Bobbitt, Heywood Iles, MD  EPINEPHrine (EPIPEN 2-PAK) 0.3 mg/0.3 mL IJ SOAJ injection USE AS DIRECTED FOR SEVERE ALLERGIC REACTION. Patient not taking: Reported on 10/18/2019 08/04/15   Cristal Ford, MD  HUMIRA PEN 40 MG/0.4ML PNKT  01/22/19   [provider]  Multiple Vitamin  (MULTI-VITAMINS) TABS Take by mouth.    [provider]      Allergies    Eluxadoline, Peanut (diagnostic), Poultry meal, Wheat, Gluten meal, Grass extracts [gramineae pollens], Influenza vaccines, Egg-derived products, and Tilactase    Review of Systems   Review of Systems  Respiratory:  Positive for cough.   Cardiovascular:  Positive for chest pain.  Ten systems reviewed and are negative for acute change, except as noted in the HPI.    Physical Exam Updated Vital Signs BP 102/81 (BP Location: Left Arm)   Pulse 73   Temp 98.5 F (36.9 C) (Oral)   Resp 16   SpO2 97%   Physical Exam Vitals and nursing note reviewed.  Constitutional:      General: She is not in acute distress.    Appearance: She is well-developed. She is not diaphoretic.     Comments: Nontoxic-appearing and in no acute distress  HENT:     Head: Normocephalic and atraumatic.  Eyes:     General: No scleral icterus.    Conjunctiva/sclera: Conjunctivae normal.  Cardiovascular:     Rate and Rhythm: Normal rate and regular rhythm.     Pulses: Normal pulses.  Pulmonary:     Effort: Pulmonary effort is normal. No respiratory distress.     Breath sounds: No stridor. No wheezing.     Comments: Respirations even and unlabored Abdominal:     Comments: Soft, nondistended, nontender  Musculoskeletal:  General: Normal range of motion.     Cervical back: Normal range of motion.  Skin:    General: Skin is warm and dry.     Coloration: Skin is not pale.     Findings: No erythema or rash.  Neurological:     Mental Status: She is alert and oriented to person, place, and time.     Coordination: Coordination normal.  Psychiatric:        Mood and Affect: Mood is anxious.        Behavior: Behavior normal.     ED Results / Procedures / Treatments   Labs (all labs ordered are listed, but only abnormal results are displayed) Labs Reviewed  BASIC METABOLIC PANEL - Abnormal; Notable for the following  components:      Result Value   Glucose, Bld 106 (*)    Calcium 8.6 (*)    All other components within normal limits  CBC - Abnormal; Notable for the following components:   WBC 1.5 (*)    Platelets 118 (*)    All other components within normal limits  TROPONIN I (HIGH SENSITIVITY)  TROPONIN I (HIGH SENSITIVITY)    EKG EKG Interpretation Date/Time:  Monday October 31 2023 19:48:44 EST Ventricular Rate:  76 PR Interval:  138 QRS Duration:  90 QT Interval:  396 QTC Calculation: 445 R Axis:   63  Text Interpretation: Normal sinus rhythm Normal ECG No previous ECGs available Confirmed by Alvester Chou [phone]) on 10/31/2023 9:40:14 PM  Radiology CT Angio Chest PE W and/or Wo Contrast Result Date: 10/31/2023 CLINICAL DATA:  Cough EXAM: CT ANGIOGRAPHY CHEST WITH CONTRAST TECHNIQUE: Multidetector CT imaging of the chest was performed using the standard protocol during bolus administration of intravenous contrast. Multiplanar CT image reconstructions and MIPs were obtained to evaluate the vascular anatomy. RADIATION DOSE REDUCTION: This exam was performed according to the departmental dose-optimization program which includes automated exposure control, adjustment of the mA and/or kV according to patient size and/or use of iterative reconstruction technique. CONTRAST:  75mL OMNIPAQUE IOHEXOL 350 MG/ML SOLN COMPARISON:  Chest x-ray from earlier in the same day. FINDINGS: Cardiovascular: Thoracic aorta and its branches appear within normal limits. No aneurysmal dilatation of the aorta is seen. Pulmonary artery shows a normal branching pattern bilaterally. No filling defect to suggest pulmonary embolism is noted. Mediastinum/Nodes: Thoracic inlet is within normal limits. No hilar or mediastinal adenopathy is noted. The esophagus as visualized is within normal limits. Lungs/Pleura: 3 mm nodule is noted in the lateral aspect of the right lower lobe best seen on image number 108 of series 6. Stable  nodule in the left lower lobe dating back to 2020 is seen. This is benign in etiology. No other nodules are noted. Upper Abdomen: Visualized upper abdomen shows no acute abnormality. Musculoskeletal: Bilateral breast implants are seen. Chronic T11 compression fracture is noted. Review of the MIP images confirms the above findings. IMPRESSION: No evidence of pulmonary emboli. Stable left lower lobe nodule. 3 mm right lower lobe nodule is noted.  No follow-up is recommended. Electronically Signed   By: Alcide Clever M.D.   On: 10/31/2023 22:03   DG Chest 2 View Result Date: 10/31/2023 CLINICAL DATA:  Cough. EXAM: CHEST - 2 VIEW COMPARISON:  October 31, 2023 (3:29 p.m.) FINDINGS: The heart size and mediastinal contours are within normal limits. An ill-defined nodular opacity is again seen overlying the right upper lobe. There is no evidence of focal consolidation, pleural effusion or pneumothorax. Multilevel degenerative  changes are seen throughout the thoracic spine. IMPRESSION: Ill-defined right upper lobe nodular opacity. Correlation with nonemergent follow-up chest CT is recommended to exclude the presence of an underlying pulmonary nodule. Electronically Signed   By: Aram Candela M.D.   On: 10/31/2023 20:59    Procedures Procedures    Medications Ordered in ED Medications  iohexol (OMNIPAQUE) 350 MG/ML injection 75 mL (75 mLs Intravenous Contrast Given 10/31/23 2131)    ED Course/ Medical Decision Making/ A&P                                 Medical Decision Making  This patient presents to the ED for concern of pleuritic chest pain, this involves an extensive number of treatment options, and is a complaint that carries with it a high risk of complications and morbidity.  The differential diagnosis includes PTX vs PNA vs PE vs costochondritis vs pericarditis.   Co morbidities that complicate the patient evaluation  Ashtma Anxiety    Additional history obtained:  Additional  history obtained from son, at bedside External records from outside source obtained and reviewed including outpatient CXR from 1527 today noting a 2cm right mid lung nodule. Imaging impression available on Care Everywhere.   Lab Tests:  I Ordered, and personally interpreted labs.  The pertinent results include:  WBC 1.5. Glucose 106. Troponin negative x2.   Imaging Studies ordered:  I ordered imaging studies including CTA chest  I independently visualized and interpreted imaging which showed stable lung nodules without other acute or emergent process. I agree with the radiologist interpretation   Cardiac Monitoring:  The patient was maintained on a cardiac monitor.  I personally viewed and interpreted the cardiac monitored which showed an underlying rhythm of: NSR   Medicines ordered and prescription drug management:  I have reviewed the patients home medicines and have made adjustments as needed   Test Considered:  Respiratory viral panel   Problem List / ED Course:  Patient was symptoms consistent with costochondritis, likely secondary to viral illness and ongoing coughing.  Was advised to come to the emergency department following urgent care assessment due to abnormalities on chest x-ray. Had a CTA performed in the ED which does not show any evidence of pulmonary embolus.  Also no pneumothorax, pneumonia, pleural effusion.  Patient does have a small lung nodule on the right and left.  These appear stable compared to prior and do not require further evaluation.  Do not feel they are contributing to the patient's symptoms today. Discussed with patient that her white blood cell count was low at 1.5.  This is consistent with likely viral process, but may benefit from being rechecked by her primary care doctor.  Otherwise normal metabolic panel.  Troponin negative x 2.  Her EKG does not show signs of acute ischemia.  Symptoms are atypical for ACS. Given history of asthma with  ongoing pleuritic chest pain, will place patient on 5-day burst of prednisone.  Have also prescribed Tessalon as a cough suppressant.  She has been encouraged to follow-up with her primary care doctor for reassessment.   Reevaluation:  After the interventions noted above, I reevaluated the patient and found that they have :stayed the same   Social Determinants of Health:  Good social support   Dispostion:  After consideration of the diagnostic results and the patients response to treatment, I feel that the patent would benefit from supportive care and  close f/u with PCP. Return precautions discussed and provided. Patient discharged in stable condition with no unaddressed concerns.           Final Clinical Impression(s) / ED Diagnoses Final diagnoses:  Costochondritis    Rx / DC Orders ED Discharge Orders          Ordered    predniSONE (DELTASONE) 20 MG tablet  Daily        10/31/23 2242    benzonatate (TESSALON) 100 MG capsule  3 times daily PRN        10/31/23 2242              Antony Madura, PA-C 10/31/23 2300    Zadie Rhine, MD 10/31/23 [phone]

## 2023-10-31 NOTE — ED Provider Triage Note (Signed)
Emergency Medicine Provider Triage Evaluation Note  Jimi Giza , a 58 y.o. female  was evaluated in triage.  Pt complains of dry cough, shob, pleuritic chest pain. Had abnormal CXR at [facility] and sent for CT scan  Review of Systems  Positive: As above Negative: Syncope, vomiting, diarrhea  Physical Exam  BP 102/81 (BP Location: Left Arm)   Pulse 73   Temp 98.5 F (36.9 C) (Oral)   Resp 16   SpO2 97%  Gen:   Awake, no distress   Resp:  Normal effort  MSK:   Moves extremities without difficulty  Other:    Medical Decision Making  Medically screening exam initiated at 8:33 PM.  Appropriate orders placed.  Emberli Ballester was informed that the remainder of the evaluation will be completed by another provider, this initial triage assessment does not replace that evaluation, and the importance of remaining in the ED until their evaluation is complete.     Lonell Grandchild, MD 10/31/23 2034

## 2023-10-31 NOTE — Discharge Instructions (Addendum)
Your evaluation in the emergency department today was reassuring.  Your imaging did show the presence of 2 lung nodules, though these appear stable and do not require further evaluation.  It is not felt that these nodules are contributing to your symptoms.  Your pain is consistent with costochondritis, brought on from frequent coughing associated with a viral illness.  Take prednisone as prescribed to help reduce inflammation in the chest and airways.  You may use Tessalon for cough as prescribed.  Follow-up with your primary care doctor.

## 2023-10-31 NOTE — ED Triage Notes (Signed)
BIB EMS from home. Cough and SOB x 4 days. Went to urgent care today due to chest pain that started. UC called and advised to come to ER because chest xray showed possible PE or pneumonia. No pain on right side.   VS  116/58 80 98% ra CBG 107 97.2 temp

## 2023-10-31 NOTE — ED Notes (Signed)
 Patient transported to X-ray

## 2024-08-24 ENCOUNTER — Encounter: Payer: Self-pay | Admitting: Adult Health

## 2024-08-24 ENCOUNTER — Telehealth (INDEPENDENT_AMBULATORY_CARE_PROVIDER_SITE_OTHER): Admitting: Adult Health

## 2024-08-24 DIAGNOSIS — F331 Major depressive disorder, recurrent, moderate: Secondary | ICD-10-CM | POA: Diagnosis not present

## 2024-08-24 DIAGNOSIS — F902 Attention-deficit hyperactivity disorder, combined type: Secondary | ICD-10-CM

## 2024-08-24 DIAGNOSIS — F411 Generalized anxiety disorder: Secondary | ICD-10-CM

## 2024-08-24 DIAGNOSIS — F4310 Post-traumatic stress disorder, unspecified: Secondary | ICD-10-CM

## 2024-08-24 MED ORDER — AMPHETAMINE-DEXTROAMPHETAMINE 10 MG PO TABS: 10.0000 mg | ORAL_TABLET | Freq: Every day | ORAL | 0 refills | Status: DC
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
## 2024-08-31 ENCOUNTER — Telehealth: Payer: Self-pay | Admitting: Adult Health

## 2024-08-31 NOTE — Telephone Encounter (Signed)
 Patient called stating that she was prescribed Adderall 10mg  to help her focus. It doesn't seem to be working she has no energy and unable to focus. Its relaxes her and almost to tired. Pls rtc [phone] Appt 1/2

## 2024-09-03 NOTE — Telephone Encounter (Signed)
 Pt seen for the first time on 12/12 and Adderall 10 mg in the afternoon prescribed. She is getting Vyvanse 50 mg from another provider. She reports no benefit from the Adderall and wants to know if she can increase it. Last filled 12/15.

## 2024-09-03 NOTE — Telephone Encounter (Signed)
 Told patient to double up on current dose and let us  know on Monday how she is doing.

## 2024-09-10 NOTE — Telephone Encounter (Signed)
 Pt called in today to report back. She has taken the Adderall 20mg  and does not notice much benefit. She has a little better focus but no energy  and does not help very much.

## 2024-09-12 NOTE — Telephone Encounter (Signed)
 Please see message from patient. We had her double up on dose and she reports not much difference in sx.

## 2024-09-14 ENCOUNTER — Encounter: Payer: Self-pay | Admitting: Adult Health

## 2024-09-14 ENCOUNTER — Telehealth: Admitting: Adult Health

## 2024-09-14 DIAGNOSIS — F909 Attention-deficit hyperactivity disorder, unspecified type: Secondary | ICD-10-CM

## 2024-09-14 DIAGNOSIS — F902 Attention-deficit hyperactivity disorder, combined type: Secondary | ICD-10-CM

## 2024-09-14 DIAGNOSIS — F329 Major depressive disorder, single episode, unspecified: Secondary | ICD-10-CM

## 2024-09-14 DIAGNOSIS — F331 Major depressive disorder, recurrent, moderate: Secondary | ICD-10-CM

## 2024-09-14 MED ORDER — SERTRALINE HCL 100 MG PO TABS: ORAL_TABLET | ORAL | 2 refills | Status: DC

## 2024-09-14 MED ORDER — LISDEXAMFETAMINE DIMESYLATE 60 MG PO CAPS: 60.0000 mg | ORAL_CAPSULE | ORAL | 0 refills | Status: DC
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
## 2024-10-04 ENCOUNTER — Encounter: Payer: Self-pay | Admitting: Adult Health

## 2024-10-04 ENCOUNTER — Telehealth: Admitting: Adult Health

## 2024-10-04 DIAGNOSIS — F411 Generalized anxiety disorder: Secondary | ICD-10-CM

## 2024-10-04 DIAGNOSIS — F902 Attention-deficit hyperactivity disorder, combined type: Secondary | ICD-10-CM | POA: Diagnosis not present

## 2024-10-04 DIAGNOSIS — F331 Major depressive disorder, recurrent, moderate: Secondary | ICD-10-CM

## 2024-10-04 MED ORDER — BUSPIRONE HCL 30 MG PO TABS: 30.0000 mg | ORAL_TABLET | Freq: Two times a day (BID) | ORAL | 2 refills | Status: AC

## 2024-10-04 MED ORDER — AMPHETAMINE-DEXTROAMPHETAMINE 20 MG PO TABS: 20.0000 mg | ORAL_TABLET | Freq: Every day | ORAL | 0 refills | Status: AC

## 2024-10-04 MED ORDER — LISDEXAMFETAMINE DIMESYLATE 60 MG PO CAPS: 60.0000 mg | ORAL_CAPSULE | ORAL | 0 refills | Status: AC

## 2024-10-04 MED ORDER — SERTRALINE HCL 100 MG PO TABS: ORAL_TABLET | ORAL | 2 refills | Status: AC
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
## 2024-10-12 ENCOUNTER — Telehealth: Admitting: Adult Health

## 2024-11-01 ENCOUNTER — Ambulatory Visit: Admitting: Adult Health
